# Patient Record
Sex: Female | Born: 1950 | Race: White | Hispanic: No | Marital: Married | Smoking: Former smoker
Health system: Southern US, Community
[De-identification: ages and names within clinical notes are randomized; demographics above are authoritative.]

## PROBLEM LIST (undated history)

## (undated) DIAGNOSIS — J189 Pneumonia, unspecified organism: Secondary | ICD-10-CM

## (undated) DIAGNOSIS — T8859XA Other complications of anesthesia, initial encounter: Secondary | ICD-10-CM

## (undated) DIAGNOSIS — K219 Gastro-esophageal reflux disease without esophagitis: Secondary | ICD-10-CM

## (undated) DIAGNOSIS — I10 Essential (primary) hypertension: Secondary | ICD-10-CM

## (undated) DIAGNOSIS — T4145XA Adverse effect of unspecified anesthetic, initial encounter: Secondary | ICD-10-CM

## (undated) DIAGNOSIS — M1990 Unspecified osteoarthritis, unspecified site: Secondary | ICD-10-CM

## (undated) DIAGNOSIS — I712 Thoracic aortic aneurysm, without rupture: Secondary | ICD-10-CM

## (undated) DIAGNOSIS — J45909 Unspecified asthma, uncomplicated: Secondary | ICD-10-CM

## (undated) HISTORY — PX: DILATION AND CURETTAGE, DIAGNOSTIC / THERAPEUTIC: SUR384

## (undated) HISTORY — DX: Gastro-esophageal reflux disease without esophagitis: K21.9

## (undated) HISTORY — DX: Essential (primary) hypertension: I10

## (undated) HISTORY — PX: HERNIA REPAIR: SHX51

## (undated) HISTORY — PX: OTHER SURGICAL HISTORY: SHX169

## (undated) HISTORY — PX: TONSILLECTOMY: SUR1361

---

## 1980-12-03 HISTORY — PX: COLON RESECTION: SHX5231

## 1999-01-05 ENCOUNTER — Other Ambulatory Visit: Admission: RE | Admit: 1999-01-05 | Discharge: 1999-01-05 | Payer: Self-pay | Admitting: Obstetrics and Gynecology

## 2000-09-06 ENCOUNTER — Other Ambulatory Visit: Admission: RE | Admit: 2000-09-06 | Discharge: 2000-09-06 | Payer: Self-pay | Admitting: Obstetrics and Gynecology

## 2001-09-10 ENCOUNTER — Other Ambulatory Visit: Admission: RE | Admit: 2001-09-10 | Discharge: 2001-09-10 | Payer: Self-pay | Admitting: Obstetrics and Gynecology

## 2001-12-11 ENCOUNTER — Encounter: Payer: Self-pay | Admitting: Urology

## 2001-12-11 ENCOUNTER — Ambulatory Visit (HOSPITAL_COMMUNITY): Admission: RE | Admit: 2001-12-11 | Discharge: 2001-12-11 | Payer: Self-pay | Admitting: Urology

## 2001-12-25 ENCOUNTER — Encounter (INDEPENDENT_AMBULATORY_CARE_PROVIDER_SITE_OTHER): Payer: Self-pay | Admitting: Specialist

## 2001-12-25 ENCOUNTER — Ambulatory Visit (HOSPITAL_BASED_OUTPATIENT_CLINIC_OR_DEPARTMENT_OTHER): Admission: RE | Admit: 2001-12-25 | Discharge: 2001-12-25 | Payer: Self-pay | Admitting: Urology

## 2002-09-22 ENCOUNTER — Other Ambulatory Visit: Admission: RE | Admit: 2002-09-22 | Discharge: 2002-09-22 | Payer: Self-pay | Admitting: Obstetrics and Gynecology

## 2003-09-29 ENCOUNTER — Other Ambulatory Visit: Admission: RE | Admit: 2003-09-29 | Discharge: 2003-09-29 | Payer: Self-pay | Admitting: Obstetrics and Gynecology

## 2004-02-04 ENCOUNTER — Other Ambulatory Visit: Admission: RE | Admit: 2004-02-04 | Discharge: 2004-02-04 | Payer: Self-pay | Admitting: Obstetrics and Gynecology

## 2004-10-20 ENCOUNTER — Other Ambulatory Visit: Admission: RE | Admit: 2004-10-20 | Discharge: 2004-10-20 | Payer: Self-pay | Admitting: Obstetrics and Gynecology

## 2005-10-23 ENCOUNTER — Other Ambulatory Visit: Admission: RE | Admit: 2005-10-23 | Discharge: 2005-10-23 | Payer: Self-pay | Admitting: Obstetrics and Gynecology

## 2006-11-06 ENCOUNTER — Ambulatory Visit: Payer: Self-pay | Admitting: Gastroenterology

## 2007-03-13 ENCOUNTER — Ambulatory Visit (HOSPITAL_COMMUNITY): Admission: RE | Admit: 2007-03-13 | Discharge: 2007-03-13 | Payer: Self-pay | Admitting: Obstetrics and Gynecology

## 2007-03-13 ENCOUNTER — Encounter (INDEPENDENT_AMBULATORY_CARE_PROVIDER_SITE_OTHER): Payer: Self-pay | Admitting: Specialist

## 2009-06-09 ENCOUNTER — Inpatient Hospital Stay (HOSPITAL_COMMUNITY): Admission: RE | Admit: 2009-06-09 | Discharge: 2009-06-10 | Payer: Self-pay | Admitting: Internal Medicine

## 2009-06-09 ENCOUNTER — Encounter (INDEPENDENT_AMBULATORY_CARE_PROVIDER_SITE_OTHER): Payer: Self-pay | Admitting: Surgery

## 2010-12-03 HISTORY — PX: APPENDECTOMY: SHX54

## 2010-12-27 ENCOUNTER — Encounter (INDEPENDENT_AMBULATORY_CARE_PROVIDER_SITE_OTHER): Payer: Self-pay | Admitting: *Deleted

## 2011-01-04 NOTE — Letter (Signed)
Summary: Pre Visit Letter Revised  [facility]  [address] [place], [place] [address]   Phone: [phone]  Fax: [phone]        12/27/2010 MRN: [id number] [name] [address] [place], [place]  [address]             Procedure Date:  February 06, 2011   recall col-Dr Abundio Miu to the Gastroenterology Division at [facility].    You are scheduled to see a nurse for your pre-procedure visit on January 23, 2011 at 10:00am on the 3rd floor at [facility], [address]  We ask that you try to arrive at our office 15 minutes prior to your appointment time to allow for check-in.  Please take a minute to review the attached form.  If you answer "Yes" to one or more of the questions on the first page, we ask that you call the person listed at your earliest opportunity.  If you answer "No" to all of the questions, please complete the rest of the form and bring it to your appointment.    Your nurse visit will consist of discussing your medical and surgical history, your immediate family medical history, and your medications.   If you are unable to list all of your medications on the form, please bring the medication bottles to your appointment and we will list them.  We will need to be aware of both prescribed and over the counter drugs.  We will need to know exact dosage information as well.    Please be prepared to read and sign documents such as consent forms, a financial agreement, and acknowledgement forms.  If necessary, and with your consent, a friend or relative is welcome to sit-in on the nurse visit with you.  Please bring your insurance card so that we may make a copy of it.  If your insurance requires a referral to see a specialist, please bring your referral form from your primary care physician.  No co-pay is required for this nurse visit.     If you cannot keep your appointment, please call [phone] to cancel or reschedule prior  to your appointment date.  This allows [place] the opportunity to schedule an appointment for another patient in need of care.    Thank you for choosing  Gastroenterology for your medical needs.  We appreciate the opportunity to care for you.  Please visit [place] at our website  to learn more about our practice.  Sincerely, The Gastroenterology Division

## 2011-01-22 ENCOUNTER — Encounter (INDEPENDENT_AMBULATORY_CARE_PROVIDER_SITE_OTHER): Payer: Self-pay | Admitting: *Deleted

## 2011-01-23 ENCOUNTER — Encounter: Payer: Self-pay | Admitting: Internal Medicine

## 2011-01-30 NOTE — Miscellaneous (Signed)
Summary: [facility] Previsit/prep  Clinical Lists Changes  Medications: Added new medication of MOVIPREP 100 GM  SOLR (PEG-KCL-NACL-NASULF-NA ASC-C) As per prep instructions. - Signed Rx of MOVIPREP 100 GM  SOLR (PEG-KCL-NACL-NASULF-NA ASC-C) As per prep instructions.;  #1 x 0;  Signed;  Entered by: Wyona Almas RN;  Authorized by: Iva Boop MD, [facility];  Method used: Electronically to [facility]  [id number]*, [address] [place], [place], [place]  [address], Ph: [phone] or [phone], Fax: [phone] Observations: Added new observation of NKA: T (01/23/2011 10:02)    Prescriptions: MOVIPREP 100 GM  SOLR (PEG-KCL-NACL-NASULF-NA ASC-C) As per prep instructions.  #1 x 0   Entered by:   Wyona Almas RN   Authorized by:   Iva Boop MD, [facility]   Signed by:   Wyona Almas RN on 01/23/2011   Method used:   Electronically to        [facility]  [phone]* (retail)       [address]       [place], [place]  [address]       Ph: [phone] or [phone]       Fax: [phone]   RxID:   [id number]

## 2011-01-30 NOTE — Letter (Signed)
Summary: [facility] Instructions  [facility]  [address] [place], [place] [address]   Phone: [phone]  Fax: [phone]       [name]    [birth year]    MRN: [id number]        Procedure Day Dorna Bloom:  Jake Shark  02/06/11     Arrival Time:  8:00AM     Procedure Time:  9:00AM     Location of Procedure:                    _ X_  [facility] (4th Floor)  PREPARATION FOR COLONOSCOPY WITH MOVIPREP   Starting 5 days prior to your procedure 02/01/11 do not eat nuts, seeds, popcorn, corn, beans, peas,  salads, or any raw vegetables.  Do not take any fiber supplements (e.g. Metamucil, Citrucel, and Benefiber).  THE DAY BEFORE YOUR PROCEDURE         DATE: 02/05/11  DAY: MONDAY  1.  Drink clear liquids the entire day-NO SOLID FOOD  2.  Do not drink anything colored red or purple.  Avoid juices with pulp.  No orange juice.  3.  Drink at least 64 oz. (8 glasses) of fluid/clear liquids during the day to prevent dehydration and help the prep work efficiently.  CLEAR LIQUIDS INCLUDE: Water Jello Ice Popsicles Tea (sugar ok, no milk/cream) Powdered fruit flavored drinks Coffee (sugar ok, no milk/cream) Gatorade Juice: apple, white grape, white cranberry  Lemonade Clear bullion, consomm, broth Carbonated beverages (any kind) Strained chicken noodle soup Hard Candy                             4.  In the morning, mix first dose of MoviPrep solution:    Empty 1 Pouch A and 1 Pouch B into the disposable container    Add lukewarm drinking water to the top line of the container. Mix to dissolve    Refrigerate (mixed solution should be used within 24 hrs)  5.  Begin drinking the prep at 5:00 p.m. The MoviPrep container is divided by 4 marks.   Every 15 minutes drink the solution down to the next mark (approximately 8 oz) until the full liter is complete.   6.  Follow completed prep with 16 oz of clear liquid of your choice (Nothing red or purple).  Continue  to drink clear liquids until bedtime.  7.  Before going to bed, mix second dose of MoviPrep solution:    Empty 1 Pouch A and 1 Pouch B into the disposable container    Add lukewarm drinking water to the top line of the container. Mix to dissolve    Refrigerate  THE DAY OF YOUR PROCEDURE      DATE: 02/06/11  DAY: TUESDAY  Beginning at 4:00AM (5 hours before procedure):         1. Every 15 minutes, drink the solution down to the next mark (approx 8 oz) until the full liter is complete.  2. Follow completed prep with 16 oz. of clear liquid of your choice.    3. You may drink clear liquids until 7:00AM (2 HOURS BEFORE PROCEDURE).   MEDICATION INSTRUCTIONS  Unless otherwise instructed, you should take regular prescription medications with a small sip of water   as early as possible the morning of your procedure.   Additional medication instructions: Hold HCTZ the morning of procedure.  OTHER INSTRUCTIONS  You will need a responsible adult at least 61 years of age to accompany you and drive you home.   This person must remain in the waiting room during your procedure.  Wear loose fitting clothing that is easily removed.  Leave jewelry and other valuables at home.  However, you may wish to bring a book to read or  an iPod/MP3 player to listen to music as you wait for your procedure to start.  Remove all body piercing jewelry and leave at home.  Total time from sign-in until discharge is approximately 2-3 hours.  You should go home directly after your procedure and rest.  You can resume normal activities the  day after your procedure.  The day of your procedure you should not:   Drive   Make legal decisions   Operate machinery   Drink alcohol   Return to work  You will receive specific instructions about eating, activities and medications before you leave.    The above instructions have been reviewed and explained to me by   Wyona Almas RN  January 23, 2011 10:38 AM     I fully understand and can verbalize these instructions _____________________________ Date _________

## 2011-02-06 ENCOUNTER — Encounter: Payer: Self-pay | Admitting: Internal Medicine

## 2011-02-06 ENCOUNTER — Other Ambulatory Visit (AMBULATORY_SURGERY_CENTER): Payer: Managed Care, Other (non HMO) | Admitting: Internal Medicine

## 2011-02-06 DIAGNOSIS — K5730 Diverticulosis of large intestine without perforation or abscess without bleeding: Secondary | ICD-10-CM

## 2011-02-06 DIAGNOSIS — Z1211 Encounter for screening for malignant neoplasm of colon: Secondary | ICD-10-CM

## 2011-02-13 NOTE — Procedures (Signed)
Summary: Colonoscopy  Patient: [name] Note: All result statuses are Final unless otherwise noted.  Tests: (1) Colonoscopy (COL)   COL Colonoscopy           DONE     [facility]     [address]     [place], [place]  [address]          COLONOSCOPY PROCEDURE REPORT          PATIENT:  [name], [name]  MR#:  [id number]     BIRTHDATE:  1950/12/05, 59 yrs. old  GENDER:  female     ENDOSCOPIST:  Iva Boop, MD, [facility]          PROCEDURE DATE:  02/06/2011     PROCEDURE:  Colonoscopy 308[address]     ASA CLASS:  Class II     INDICATIONS:  Routine Risk Screening     MEDICATIONS:   Fentanyl 75 mcg IV, Versed 8 mg IV          DESCRIPTION OF PROCEDURE:   After the risks benefits and     alternatives of the procedure were thoroughly explained, informed     consent was obtained.  Digital rectal exam was performed and     revealed no abnormalities.   The LB 180AL [id number] endoscope was     introduced through the anus and advanced to the cecum, which was     identified by both the appendix and ileocecal valve, without     limitations.  The quality of the prep was excellent, using     MoviPrep.  The instrument was then slowly withdrawn as the colon     was fully examined. Insertion: 2:44 minutes Withdrawal: 10:57     minutes     <<PROCEDUREIMAGES>>          FINDINGS:  Scattered diverticula were found in the left colon.     This was otherwise a normal examination of the colon including     right colon retroflexion.   Retroflexed views in the rectum     revealed no abnormalities.    The scope was then withdrawn from     the patient and the procedure completed.          COMPLICATIONS:  None     ENDOSCOPIC IMPRESSION:     1) Diverticula, scattered in the left colon     2) Otherwise normal examination, excellent prep          REPEAT EXAM:  In 10 year(s) for routine screening colonoscopy.          Iva Boop, MD, [facility]          CC:  Marinda Elk, MD and The Patient          n.     eSIGNED:   Iva Boop at 02/06/2011 09:57 AM          [name], [id number]  Note: An exclamation mark (!) indicates a result that was not dispersed into the flowsheet. Document Creation Date: 02/06/2011 9:57 AM _______________________________________________________________________  (1) Order result status: Final Collection or observation date-time: 02/06/2011 09:49 Requested date-time:  Receipt date-time:  Reported date-time:  Referring Physician:   Ordering Physician: Stan Head [phone]) Specimen Source:  Source: Launa Grill Order Number: [id number] Lab site:   Appended Document: Colonoscopy    Clinical Lists Changes  Observations: Added new observation of COLONNXTDUE: 01/2021 (02/06/2011 16:26)

## 2011-03-11 LAB — DIFFERENTIAL
Basophils Absolute: 0.1 10*3/uL (ref 0.0–0.1)
Eosinophils Absolute: 0.2 10*3/uL (ref 0.0–0.7)
Eosinophils Relative: 2 % (ref 0–5)
Lymphocytes Relative: 25 % (ref 12–46)
Lymphs Abs: 3.3 10*3/uL (ref 0.7–4.0)
Monocytes Absolute: 0.7 10*3/uL (ref 0.1–1.0)

## 2011-03-11 LAB — CBC
HCT: 40.1 % (ref 36.0–46.0)
Hemoglobin: 13.6 g/dL (ref 12.0–15.0)
MCV: 85.8 fL (ref 78.0–100.0)
Platelets: 230 10*3/uL (ref 150–400)
RDW: 14.8 % (ref 11.5–15.5)

## 2011-03-11 LAB — BASIC METABOLIC PANEL
BUN: 11 mg/dL (ref 6–23)
CO2: 27 mEq/L (ref 19–32)
Chloride: 106 mEq/L (ref 96–112)
GFR calc non Af Amer: >60 mL/min (ref >60)
Glucose, Bld: 98 mg/dL (ref 70–99)
Potassium: 3.6 mEq/L (ref 3.5–5.1)
Sodium: 140 mEq/L (ref 135–145)

## 2011-03-11 LAB — CREATININE, SERUM: Creatinine, Ser: 0.71 mg/dL (ref 0.4–1.2)

## 2011-03-11 LAB — BUN: BUN: 12 mg/dL (ref 6–23)

## 2011-04-17 NOTE — Op Note (Signed)
[name], [name]               ACCOUNT NO.:  [id number]   MEDICAL RECORD NO.:  [id number]          PATIENT TYPE:  INP   LOCATION:  0098                         FACILITY:  [facility]   PHYSICIAN:  Velora Heckler, MD      DATE OF BIRTH:  01-20-1951   DATE OF PROCEDURE:  06/10/2009  DATE OF DISCHARGE:                               OPERATIVE REPORT   PREOPERATIVE DIAGNOSIS:  Acute appendicitis.   POSTOPERATIVE DIAGNOSIS:  Acute appendicitis.   PROCEDURE:  Laparoscopic appendectomy.   SURGEON:  Velora Heckler, MD, FACS   ANESTHESIA:  General per Dr. Jill Side.   ESTIMATED BLOOD LOSS:  Minimal.   PREPARATION:  Betadine.   COMPLICATIONS:  None.   INDICATIONS:  The patient is a 60 year old white female who presents to  the emergency department at [facility] on [facility] from [facility] with signs and symptoms of acute appendicitis.  The patient  underwent a CT scan of the abdomen at [facility] which  confirmed the diagnosis of acute appendicitis.  General surgery was  called for management and the patient was prepared for the operating  room.   BODY OF REPORT:  Procedure was done in OR #1 at the [facility].  The patient was brought to the operating room and  placed in a supine position on the operating room table.  Following  administration of general anesthesia, the patient was positioned and  then prepped and draped in the usual strict aseptic fashion.  After  ascertaining that an adequate level of anesthesia had been achieved, an  infraumbilical incision was made in the midline with a #15 blade.  Dissection was carried down to the fascia.  The fascia was incised in  the midline and the peritoneal cavity was entered cautiously.  A 0  Vicryl pursestring suture was placed in the fascia.  A Hassan cannula  was introduced under direct vision and secured with a pursestring  suture.  The abdomen was insufflated with carbon  dioxide.  Laparoscope  was introduced and the abdomen explored.  Operative port was placed in  the right upper quadrant.  Adhesions in the right mid abdomen were taken  down with the harmonic scalpel.  This provided visualization of the  descending colon and cecum.  There was clear fluid present within the  pelvis.  There were some inflammatory changes in the right midabdomen  and clear yellow fluid.  The appendix was identified.  It was tense and  distended, but not grossly perforated.  Operative port was placed in the  left upper quadrant.  Using the harmonic scalpel, the mesoappendix was  taken down.  Dissection was carried down to the base of the appendix  with some difficulty.  The base of the appendix was exposed.  Using an  Endo-GIA stapler, the base of the appendix was transected at its  junction with the cecal wall.  The appendix was placed into an EndoCatch  bag and withdrawn through the umbilical port.  It was submitted to  pathology.  The right lower quadrant  was irrigated with warm saline  which was evacuated.  Fluid was evacuated from the pelvis, as well as  from the right upper quadrant.  Good hemostasis was noted at the staple  line and along the mesoappendix.  The area was covered with the omentum.  Pneumoperitoneum was released and ports were removed.  Good hemostasis  was noted.  The 0 Vicryl pursestring suture was tied securely.  The skin  incisions were cauterized with the electrocautery.  The wounds were  closed with interrupted 4-0 Monocryl subcuticular sutures.  The wounds  were washed and dried and Benzoin and Steri-Strips were applied.  Sterile dressings were applied.  The patient was awakened from  anesthesia and brought to the recovery room in stable condition.  The  patient tolerated the procedure well.      Velora Heckler, MD  Electronically Signed     TMG/MEDQ  D:  06/10/2009  T:  06/10/2009  Job:  [id number]   cc:   Molly Maduro L. Foy Guadalajara, M.D.  Fax:  [phone]   Peyton Najjar, MD   Miguel Aschoff, M.D.

## 2011-04-17 NOTE — H&P (Signed)
NAMESUMAYYA, MUHA               ACCOUNT NO.:  [id number]   MEDICAL RECORD NO.:  [id number]          PATIENT TYPE:  EMS   LOCATION:  ED                           [facility]  [facility]   PHYSICIAN:  Velora Heckler, MD      DATE OF BIRTH:  May 25, 1951   DATE OF ADMISSION:  06/09/2009  DATE OF DISCHARGE:                              HISTORY & PHYSICAL   REFERRING PHYSICIAN:  Peyton Najjar, MD, [facility].   PRIMARY PHYSICIAN:  Dr. Marinda Elk, [facility].   GYNECOLOGIST:  Miguel Aschoff, M.D.   CHIEF COMPLAINT:  Abdominal pain, acute appendicitis.   HISTORY OF PRESENT ILLNESS:  [name] is a 60 year old white  female from [place], [place].  She presented today to [facility] with a 24-hour history of abdominal pain localizing to the  right lower quadrant.  The patient was sent to [facility] for  CT scan of the abdomen and pelvis which revealed findings consistent  with acute appendicitis.  Laboratory studies are pending.  General  surgery was asked to evaluate and manage for acute appendicitis.   PAST MEDICAL HISTORY:  1. Status post cesarean section.  2. History of colon perforation with resection.  3. History of hypertension.   MEDICATIONS:  Hormone-replacement therapy, hydrochlorothiazide, vitamin  D.   ALLERGIES:  NONE KNOWN.   SOCIAL HISTORY:  The patient is married with two children.  She is  accompanied by her husband.  She is a stay-at-home mom.  She quit  smoking in 1976.  She drinks alcohol on occasion.   FAMILY HISTORY:  Noncontributory.   A 15 system review without significant other findings except as noted  above.   PHYSICAL EXAMINATION:  GENERAL:  A 60 year old well-developed, well-  nourished white female in no acute distress.  VITAL SIGNS:  Temperature 98.4, pulse 69, respirations 20, blood  pressure 118/69.  HEENT:  Shows her to be normocephalic, atraumatic.  Sclerae clear.  Conjunctivae clear.   Pupils equal and reactive.  Dentition good.  Mucous  membranes moist.  Voice normal.  NECK:  Supple, nontender without mass.  CHEST:  Auscultation of the chest shows good breath sounds bilaterally  without rales, rhonchi or wheeze.  CARDIAC:  Shows regular rate and rhythm without murmur.  Peripheral  pulses are full.  EXTREMITIES:  Nontender without edema.  ABDOMEN:  Soft without distention.  Bowel sounds were present.  Well-  healed surgical wound left lower quadrant.  Palpation reveals tenderness  in the right lower quadrant with voluntary guarding.  No mass.  No  hepatosplenomegaly.  EXTREMITIES:  Nontender without edema.  NEUROLOGICAL:  The patient is alert and oriented without focal deficit.  There is no sign of tremor.   LABORATORY STUDIES:  White count 12.9, hemoglobin 13.6, hematocrit  40.1%, platelet count 230,000, BUN 12, creatinine 0.71.   RADIOGRAPHIC STUDIES:  CT scan of the abdomen and pelvis with findings  consistent with acute appendicitis reviewed in the emergency department.   IMPRESSION:  Acute appendicitis.   PLAN:  I discussed at length with the  patient and her husband the  indications for surgery.  I explained laparoscopic appendectomy.  I  explained the possibility of conversion to an open procedure.  I  explained the hospital stay to be expected and her recovery.  They  understand and wish to proceed.  We will make arrangements with the  operating room immediately.      Velora Heckler, MD  Electronically Signed     TMG/MEDQ  D:  06/09/2009  T:  06/10/2009  Job:  [id number]   cc:   Miguel Aschoff, M.D.   Peyton Najjar, MD   Doris Cheadle. Foy Guadalajara, M.D.  Fax: [phone]

## 2011-04-20 NOTE — Op Note (Signed)
NAMEDEBBI, [name]               ACCOUNT NO.:  [id number]   MEDICAL RECORD NO.:  [id number]          PATIENT TYPE:  AMB   LOCATION:  SDC                           FACILITY:  [facility]   PHYSICIAN:  Miguel Aschoff, M.D.       DATE OF BIRTH:  1951-09-09   DATE OF PROCEDURE:  DATE OF DISCHARGE:                               OPERATIVE REPORT   PREOPERATIVE DIAGNOSIS:  Irregular vaginal bleeding, hormone replacement  therapy.   POSTOPERATIVE DIAGNOSIS:  Irregular vaginal bleeding, hormone  replacement therapy.   PROCEDURE:  Cervical dilatation, hysteroscopy, uterine curettage.   SURGEON:  Dr. Miguel Aschoff.   ANESTHESIA:  IV sedation with paracervical block.   COMPLICATIONS:  None.   JUSTIFICATION:  The patient is a 60 year old white female on Climara Pro  therapy.  The patient had episodes of irregular bleeding the etiology of  which is not clear.  She presents now to undergo hysteroscopy and D&C to  assess the cause of bleeding, see if therapy could be instituted.  The  risks and benefits of procedure were discussed with the patient.   PROCEDURE:  The patient was taken to the operating placed in supine  position and IV sedation was administered without difficulty.  She was  then placed in dorsolithotomy position, prepped and draped in usual  sterile fashion.  Examination under anesthesia revealed normal external  genitalia, normal Bartholin and Skene's glands, normal urethra.  The  vaginal vault was without gross lesion.  The cervix was somewhat  stenotic.  The uterus was anterior normal size and shape.  No adnexal  masses were noted.  At this point speculum was placed in the vaginal  vault.  Anterior cervical lip was grasped with tenaculum and then using  serial Pratt dilators, the cervix was dilated until  #25 Pratt dilator  could be passed.  Once this was done, the diagnostic hysteroscope was  advanced through the endocervix.  No endocervical lesions were noted.  Inspection at the  endometrial cavity revealed only a small submucous  myoma in the left lower portion of the endometrial cavity.  The  remainder of the cavity appeared unremarkable with no polyps noted.  There are no other lesions noted within the cavity.  At this point the  hysteroscope was removed and vigorous sharp curettage was carried out  using a medium-size serrated curette.  The endometrial tissue was sent  for histologic study.  At this point the procedure was completed.  The  instruments were removed.  Hemostasis was readily achieved.  The patient  was taken out of lithotomy position, brought to recovery room in  satisfactory condition.  The estimated blood loss was approximately 30  mL.   PLAN:  Patient to be discharged home.  Medications for home include  doxycycline 100 mg twice a day x3 days, Darvocet N 100 one every 4 hours  as needed for pain.  The patient is to continue her Climara Pro.  She is  to call for pathology report in 5 days.  She is to call any problems  such as fever, pain or heavy bleeding.  She will be seen back in four  weeks for follow-up exam.      Miguel Aschoff, M.D.  Electronically Signed     AR/MEDQ  D:  03/13/2007  T:  03/13/2007  Job:  [id number]

## 2011-04-20 NOTE — Op Note (Signed)
[facility]  Patient:    [name], [name] Visit Number: [id number] MRN: [id number]          Service Type: NES Location: NESC Attending Physician:  Laqueta Jean Dictated by:   Vonzell Schlatter Patsi Sears, M.D. Proc. Date: 12/25/01 Admit Date:  12/25/2001 Discharge Date: 12/25/2001                             Operative Report  PREOPERATIVE DIAGNOSIS:  Functional inhibitory concentration.  POSTOPERATIVE DIAGNOSIS:  Functional inhibitory concentration.  OPERATION:  Cystourethroscopy, hydrodistention of bladder (900 cc to 1000 cc), biopsy of the bladder, cauterization of biopsy sites, insertion of Pyridium and Marcaine into the bladder, injection of Marcaine and Kenalog into the subtrigonal space, insertion of B&O suppository and Xylocaine into the bladder.  PREPARATION:  After appropriate preanesthesia, the patient is brought to the operating room and placed on the operating table in the dorsal supine position, where general LMA anesthesia was introduced. She was then re-placed in a dorsal lithotomy position, where the pubis was prepped with Betadine solution and draped in the usual fashion.  REVIEW OF HISTORY:  This 60 year old female has a history of failure of antibiotic therapy for presumed bladder infection.  She has also had hematuria, with no white cells.  She complains of urinary burning and pelvic pain and spasm; has been treated for interstitial cystitis by Dr. Georgiann Mccoy in [place], [place] 15 years ago.  She is now for IC evaluation.  DESCRIPTION OF PROCEDURE:  The urethra is dilated to a size 26-French, with the Siemens urethral sounds.  A 22-French cystoscope was placed in the bladder, and hydrodistention revealed the 900 cc bladder, which distends to 1000 cc.  Biopsy is taken of the bladder, and biopsy sites are cauterized. Marcaine and pyridium solution is inserted into the bladder, and Marcaine and Kenalog is injected into  the bladder base transvaginally.  Xylocaine jelly is placed in the bladder, B&O suppository is placed (beginning of case), and the patient is awakened after given IV Toradol.  She was taken to recovery room in good condition. Dictated by:   Vonzell Schlatter Patsi Sears, M.D. Attending Physician:  Laqueta Jean DD:  12/25/01 TD:  12/26/01 Job: [id number]

## 2013-12-02 ENCOUNTER — Other Ambulatory Visit: Payer: Self-pay | Admitting: Obstetrics and Gynecology

## 2014-02-18 ENCOUNTER — Encounter: Payer: Self-pay | Admitting: Podiatry

## 2014-02-18 ENCOUNTER — Ambulatory Visit (INDEPENDENT_AMBULATORY_CARE_PROVIDER_SITE_OTHER): Payer: BC Managed Care – PPO

## 2014-02-18 ENCOUNTER — Ambulatory Visit (INDEPENDENT_AMBULATORY_CARE_PROVIDER_SITE_OTHER): Payer: BC Managed Care – PPO | Admitting: Podiatry

## 2014-02-18 VITALS — BP 146/78 | HR 67 | Resp 12

## 2014-02-18 DIAGNOSIS — R52 Pain, unspecified: Secondary | ICD-10-CM

## 2014-02-18 DIAGNOSIS — M7750 Other enthesopathy of unspecified foot: Secondary | ICD-10-CM

## 2014-02-18 MED ORDER — TRIAMCINOLONE ACETONIDE 10 MG/ML IJ SUSP
10.0000 mg | Freq: Once | INTRAMUSCULAR | Status: AC
Start: 2014-02-18 — End: 2014-02-18
  Administered 2014-02-18: 10 mg

## 2014-02-18 NOTE — Progress Notes (Signed)
   Subjective:    Patient ID: [name], female    DOB: 02/18/1951, 63 y.o.   MRN: [id number]  HPI PT STATED LT BACK OF THE LT FOOT IS SORE AND HAVE BURNING SENSATION FOR 1 MONTH. SOMETIMES IS GETTING WORSE FIRST THING IN THE MORNING, BUT TRIED NO TREATMENT.    Review of Systems  All other systems reviewed and are negative.       Objective:   Physical Exam        Assessment & Plan:

## 2014-02-19 NOTE — Progress Notes (Signed)
Subjective:     Patient ID: [name], female   DOB: [birth year], 63 y.o.   MRN: [id number]  HPI patient states she is getting pain in the back of the left heel and she's not sure what might be causing this but she has trouble walk   Review of Systems     Objective:   Physical Exam Neurovascular status intact with inflammation and pain posterior lateral aspect left heel with central and medial good and no muscle strength loss    Assessment:     Achilles tendinitis left    Plan:     Advised on injection explained risk of tear associated with this patient wants to have this understanding risks and today I did a very careful lateral injection 3 mg dexamethasone Kenalog 5 mg Xylocaine and advised him reduced activity. Reappoint as symptoms indicate

## 2014-08-12 ENCOUNTER — Encounter: Payer: Self-pay | Admitting: Internal Medicine

## 2014-12-08 ENCOUNTER — Other Ambulatory Visit: Payer: Self-pay | Admitting: Obstetrics and Gynecology

## 2014-12-09 LAB — CYTOLOGY - PAP

## 2015-05-17 ENCOUNTER — Encounter: Payer: Self-pay | Admitting: Internal Medicine

## 2015-05-17 ENCOUNTER — Ambulatory Visit (INDEPENDENT_AMBULATORY_CARE_PROVIDER_SITE_OTHER)
Admission: RE | Admit: 2015-05-17 | Discharge: 2015-05-17 | Disposition: A | Discharge status: Home / Self Care | Payer: BLUE CROSS/BLUE SHIELD | Source: Ambulatory Visit | Attending: Internal Medicine | Admitting: Internal Medicine

## 2015-05-17 ENCOUNTER — Ambulatory Visit (INDEPENDENT_AMBULATORY_CARE_PROVIDER_SITE_OTHER): Payer: BLUE CROSS/BLUE SHIELD | Admitting: Internal Medicine

## 2015-05-17 VITALS — BP 124/70 | HR 74 | Ht 69.0 in | Wt 191.4 lb

## 2015-05-17 DIAGNOSIS — R05 Cough: Secondary | ICD-10-CM | POA: Diagnosis not present

## 2015-05-17 DIAGNOSIS — R942 Abnormal results of pulmonary function studies: Secondary | ICD-10-CM

## 2015-05-17 DIAGNOSIS — R059 Cough, unspecified: Secondary | ICD-10-CM

## 2015-05-17 DIAGNOSIS — R058 Other specified cough: Secondary | ICD-10-CM

## 2015-05-17 NOTE — Progress Notes (Signed)
Quick Note:  Spoke with pt and notified of results per Dr. Wert. Pt verbalized understanding and denied any questions.  ______ 

## 2015-05-17 NOTE — Progress Notes (Addendum)
Subjective:    Patient ID: [name], female    DOB: 1951-08-08,    MRN: [id number]  HPI  75 yowf never regular smoker lived in [place] where in 3rd grade  pna bad pna /prolonged admit and felt like recovered but since young adult tendency to head colds to end up in chest with worsening severity of cough > vomit with 2 episodes in 2016 the first in March and again in May referred by [name] for recurrent cough with evidence of airflow obst on office spirometry 05/12/15    05/17/2015 1st [facility] office visit/ [name]   Chief Complaint  Patient presents with  . Pulmonary Consult    Referred by Joanne. Foy Howard. Pt c/o cough "for years" worse for the past 2 yrs with wheezing,    tends to cough/ clear throat sev times a day/ sometimes at hs but not typically first thing in am and usually nonproductive, hears herself wheeze When cough worse takes codeine and lots of cough drops / sometimes abx/ pred help/ assoc with intermittent HB/ takes prn ppi dulera tried/shaking and cough no better   No obvious day to day or daytime variabilty or assoc sob or  cp or chest tightness,  overt sinus   symptoms. No unusual exp hx or h/o childhood pna/ asthma or knowledge of premature birth.  Sleeping ok without nocturnal  or early am exacerbation  of respiratory  c/o's or need for noct saba. Also denies any obvious fluctuation of symptoms with weather or environmental changes or other aggravating or alleviating factors except as outlined above   Current Medications, Allergies, Complete Past Medical History, Past Surgical History, Family History, and Social History were reviewed in [employer] record.       Review of Systems  Constitutional: Negative for fever, chills and unexpected weight change.  HENT: Negative for congestion, dental problem, ear pain, nosebleeds, postnasal drip, rhinorrhea, sinus pressure, sneezing, sore throat, trouble swallowing and voice change.     Eyes: Negative for visual disturbance.  Respiratory: Positive for cough. Negative for choking and shortness of breath.   Cardiovascular: Negative for chest pain and leg swelling.  Gastrointestinal: Negative for vomiting, abdominal pain and diarrhea.  Genitourinary: Negative for difficulty urinating.       Acid heartburn   Musculoskeletal: Negative for arthralgias.  Skin: Negative for rash.  Neurological: Negative for tremors, syncope and headaches.  Hematological: Does not bruise/bleed easily.       Objective:   Physical Exam  Pleasant amb wf nad with classic pseudowheeze only on FVC maneuver   Wt Readings from Last 3 Encounters:  05/17/15 191 lb 6.4 oz (86.818 kg)    Vital signs reviewed   HEENT: nl dentition, turbinates, and orophanx. Nl external ear canals without cough reflex   NECK :  without JVD/Nodes/TM/ nl carotid upstrokes bilaterally   LUNGS: no acc muscle use, clear to A and P bilaterally without cough on insp or exp maneuvers   CV:  RRR  no s3 or murmur or increase in P2, no edema   ABD:  soft and nontender with nl excursion in the supine position. No bruits or organomegaly, bowel sounds nl  MS:  warm without deformities, calf tenderness, cyanosis or clubbing  SKIN: warm and dry without lesions    NEURO:  alert, approp, no deficits     I personally reviewed images and agree with radiology impression as follows:  CXR:  05/17/15 The lungs are clear. Heart  size is normal. There is no pneumothorax or pleural effusion. No focal bony abnormality is identified.       Assessment & Plan:

## 2015-05-17 NOTE — Patient Instructions (Addendum)
Please remember to go to the x-ray department downstairs for your tests - we will call you with the results when they are available.  Change prilosec 40 mg  Take  30-60 min before first meal of the day and Pepcid (famotidine)  20 mg one @  bedtime until return to office - this is the best way to tell whether stomach acid is contributing to your problem.    GERD (REFLUX)  is an extremely common cause of respiratory symptoms just like yours , many times with no obvious heartburn at all.    It can be treated with medication, but also with lifestyle changes including avoidance of late meals, elevation of the head of your bed (ideally with 6 inch  bed blocks) excessive alcohol, smoking cessation, and avoid fatty foods, chocolate, peppermint, colas, red wine, and acidic juices such as orange juice.  NO MINT OR MENTHOL PRODUCTS SO NO COUGH DROPS  USE SUGARLESS CANDY INSTEAD (Jolley ranchers or Stover's or Life Savers) or even ice chips will also do - the key is to swallow to prevent all throat clearing. NO OIL BASED VITAMINS - use powdered substitutes - no Fish oil.   As needed ok to use xopenex hfa  Up to 2 pffs every 4 hours for breathing or coughing problems    Please schedule a follow up office visit in 6 weeks, call sooner if needed

## 2015-05-18 ENCOUNTER — Encounter: Payer: Self-pay | Admitting: Internal Medicine

## 2015-05-18 DIAGNOSIS — J4530 Mild persistent asthma, uncomplicated: Secondary | ICD-10-CM | POA: Insufficient documentation

## 2015-05-18 NOTE — Assessment & Plan Note (Signed)
The most common causes of chronic cough in immunocompetent adults include the following: upper airway cough syndrome (UACS), previously referred to as postnasal drip syndrome (PNDS), which is caused by variety of rhinosinus conditions; (2) asthma; (3) GERD; (4) chronic bronchitis from cigarette smoking or other inhaled environmental irritants; (5) nonasthmatic eosinophilic bronchitis; and (6) bronchiectasis.   These conditions, singly or in combination, have accounted for up to 94% of the causes of chronic cough in prospective studies.   Other conditions have constituted no >6% of the causes in prospective studies These have included bronchogenic carcinoma, chronic interstitial pneumonia, sarcoidosis, left ventricular failure, ACEI-induced cough, and aspiration from a condition associated with pharyngeal dysfunction.    Chronic cough is often simultaneously caused by more than one condition. A single cause has been found from 38 to 82% of the time, multiple causes from 18 to 62%. Multiply caused cough has been the result of three diseases up to 42% of the time.       Based on hx and exam, this is most likely:  Classic Upper airway cough syndrome, so named because it's frequently impossible to sort out how much is  CR/sinusitis with freq throat clearing (which can be related to primary GERD)   vs  causing  secondary (" extra esophageal")  GERD from wide swings in gastric pressure that occur with throat clearing, often  promoting self use of mint and menthol lozenges that reduce the lower esophageal sphincter tone and exacerbate the problem further in a cyclical fashion.   These are the same pts (now being labeled as having "irritable larynx syndrome" by some cough centers) who not infrequently have a history of having failed to tolerate ace inhibitors,  dry powder inhalers or biphosphonates or report having atypical reflux symptoms that don't respond to standard doses of PPI - esp if taken prn, and are  easily confused as having aecopd or asthma flares by even experienced allergists/ pulmonologists.   The first step is to maximize acid suppression and eliminate cyclical coughing then regroup    See instructions for specific recommendations which were reviewed directly with the patient who was given a copy with highlighter outlining the key components.

## 2015-05-18 NOTE — Assessment & Plan Note (Signed)
Spirometry 05/12/15 abn f/v loop   I agree with Dr Pablo Lawrence interpretation - the f/v loop is basically nl except when she coughed and there is no sign airflow obst.

## 2015-07-12 ENCOUNTER — Ambulatory Visit (INDEPENDENT_AMBULATORY_CARE_PROVIDER_SITE_OTHER): Payer: BLUE CROSS/BLUE SHIELD | Admitting: Internal Medicine

## 2015-07-12 ENCOUNTER — Encounter: Payer: Self-pay | Admitting: Internal Medicine

## 2015-07-12 VITALS — BP 110/70 | HR 71 | Ht 68.5 in | Wt 190.0 lb

## 2015-07-12 DIAGNOSIS — R05 Cough: Secondary | ICD-10-CM

## 2015-07-12 DIAGNOSIS — R058 Other specified cough: Secondary | ICD-10-CM

## 2015-07-12 DIAGNOSIS — R059 Cough, unspecified: Secondary | ICD-10-CM

## 2015-07-12 DIAGNOSIS — J4530 Mild persistent asthma, uncomplicated: Secondary | ICD-10-CM | POA: Diagnosis not present

## 2015-07-12 LAB — PULMONARY FUNCTION TEST
DL/VA % PRED: 85 %
DL/VA: 4.54 ml/min/mmHg/L
DLCO UNC: 24.18 ml/min/mmHg
DLCO unc % pred: 79 %
FEF 25-75 Post: 1.38 L/sec
FEF 25-75 Pre: 1.19 L/sec
FEF2575-%CHANGE-POST: 15 %
FEF2575-%PRED-POST: 57 %
FEF2575-%Pred-Pre: 49 %
FEV1-%CHANGE-POST: 5 %
FEV1-%PRED-POST: 82 %
FEV1-%PRED-PRE: 78 %
FEV1-PRE: 2.24 L
FEV1-Post: 2.36 L
FEV1FVC-%Change-Post: 4 %
FEV1FVC-%PRED-PRE: 83 %
FEV6-%Change-Post: 2 %
FEV6-%PRED-POST: 98 %
FEV6-%PRED-PRE: 95 %
FEV6-POST: 3.53 L
FEV6-PRE: 3.43 L
FEV6FVC-%Change-Post: 1 %
FEV6FVC-%PRED-POST: 103 %
FEV6FVC-%PRED-PRE: 102 %
FVC-%Change-Post: 1 %
FVC-%Pred-Post: 94 %
FVC-%Pred-Pre: 93 %
FVC-POST: 3.54 L
FVC-Pre: 3.49 L
POST FEV6/FVC RATIO: 100 %
PRE FEV1/FVC RATIO: 64 %
PRE FEV6/FVC RATIO: 98 %
Post FEV1/FVC ratio: 67 %
RV % PRED: 73 %
RV: 1.7 L
TLC % PRED: 94 %
TLC: 5.41 L

## 2015-07-12 MED ORDER — MONTELUKAST SODIUM 10 MG PO TABS: 10.0000 mg | ORAL_TABLET | Freq: Every day | ORAL | Status: DC

## 2015-07-12 NOTE — Progress Notes (Signed)
Subjective:    Patient ID: [name], female    DOB: 06-Dec-1950    MRN: [id number]    Brief patient profile:  71 yowf never regular smoker lived in [place] where in 3rd grade  bad pna /prolonged admit and felt like recovered but since young adult tendency to head colds to end up in chest with worsening severity of cough > vomit with 2 episodes in 2016 the first in March 2016  and again in May 2016  referred by Dr Foy Guadalajara for recurrent cough with evidence of airflow obst on office spirometry 05/12/15 and pfts done 07/12/2015 c/w very mild chronic airflow obst with  Non reversible component.    History of Present Illness  05/17/2015 1st [facility] office visit/ [name]   Chief Complaint  Patient presents with  . Pulmonary Consult    Referred by Dr. Foy Guadalajara. Pt c/o cough "for years" worse for the past 2 yrs with wheezing,    tends to cough/ clear throat sev times a day/ sometimes at hs but not typically first thing in am and usually nonproductive, hears herself wheeze When cough worse takes codeine and lots of cough drops / sometimes abx/ pred help/ assoc with intermittent HB/ takes prn ppi dulera tried/shaking and cough no better  rec Please remember to go to the x-ray department downstairs for your tests - we will call you with the results when they are available. Change prilosec 40 mg  Take  30-60 min before first meal of the day and Pepcid (famotidine)  20 mg one @  bedtime until return to office - this is the best way to tell whether stomach acid is contributing to your problem.   GERD  Diet  As needed ok to use xopenex hfa  Up to 2 pffs every 4 hours for breathing or coughing problems     07/12/2015 f/u ov/[name] re: mild persistent asthma  Chief Complaint  Patient presents with  . Follow-up    PFT done today. Pt states her cough has improved some. No new co's today.   was taking acid suppression prior to the last flare and had same severe head cold to chest symptoms  progression  Presently no symptoms and no need for xopenex at all    No obvious day to day or daytime variability or assoc   cp or chest tightness, subjective wheeze or overt sinus or hb symptoms. No unusual exp hx or h/o childhood pna/ asthma or knowledge of premature birth.  Sleeping ok without nocturnal  or early am exacerbation  of respiratory  c/o's or need for noct saba. Also denies any obvious fluctuation of symptoms with weather or environmental changes or other aggravating or alleviating factors except as outlined above   Current Medications, Allergies, Complete Past Medical History, Past Surgical History, Family History, and Social History were reviewed in [employer] record.  ROS  The following are not active complaints unless bolded sore throat, dysphagia, dental problems, itching, sneezing,  nasal congestion or excess/ purulent secretions, ear ache,   fever, chills, sweats, unintended wt loss, classically pleuritic or exertional cp, hemoptysis,  orthopnea pnd or leg swelling, presyncope, palpitations, abdominal pain, anorexia, nausea, vomiting, diarrhea  or change in bowel or bladder habits, change in stools or urine, dysuria,hematuria,  rash, arthralgias, visual complaints, headache, numbness, weakness or ataxia or problems with walking or coordination,  change in mood/affect or memory.  Objective:   Physical Exam  Pleasant amb wf nad    Wt Readings from Last 3 Encounters:  07/12/15 190 lb (86.183 kg)  05/17/15 191 lb 6.4 oz (86.818 kg)    Vital signs reviewed     HEENT: nl dentition, turbinates, and orophanx. Nl external ear canals without cough reflex   NECK :  without JVD/Nodes/TM/ nl carotid upstrokes bilaterally   LUNGS: no acc muscle use, clear to A and P bilaterally without cough on insp or exp maneuvers   CV:  RRR  no s3 or murmur or increase in P2, no edema   ABD:  soft and nontender with nl excursion in the supine  position. No bruits or organomegaly, bowel sounds nl  MS:  warm without deformities, calf tenderness, cyanosis or clubbing  SKIN: warm and dry without lesions    NEURO:  alert, approp, no deficits     I personally reviewed images and agree with radiology impression as follows:  CXR:  05/17/15 The lungs are clear. Heart size is normal. There is no pneumothorax or pleural effusion. No focal bony abnormality is identified.       Assessment & Plan:

## 2015-07-12 NOTE — Patient Instructions (Addendum)
singulair 10 mg one daily typically taken in evening automatically   Please schedule a follow up visit in 6 months but call sooner if needed  If continues to flare needs sinus ct/ allergy profile and trial of qvar maint rx

## 2015-07-12 NOTE — Assessment & Plan Note (Signed)
-   PFT's while asymptomatic 07/12/2015  FEV1  2.36 (82%) ratio 67 and no better p saba and dlco 79% corrects to 85%  Unclear how much asthma contributes to her cough or decompensation with uri's but this pattern has been more frequent x one year despite maint rx with gerd rx so rec trial of singulair  I had an extended discussion with the patient reviewing all relevant studies completed to date and  lasting 15 to 20 minutes of a 25 minute visit    Each maintenance medication was reviewed in detail including most importantly the difference between maintenance and prns and under what circumstances the prns are to be triggered using an action plan format that is not reflected in the computer generated alphabetically organized AVS.    Please see instructions for details which were reviewed in writing and the patient given a copy highlighting the part that I personally wrote and discussed at today's ov.

## 2015-07-12 NOTE — Progress Notes (Signed)
PFT done today. 

## 2015-07-12 NOTE — Assessment & Plan Note (Signed)
The challenge is to prevent recurrence at this point > see mild chronic asthma for rx with singulair maint rx

## 2015-09-03 DIAGNOSIS — I77810 Thoracic aortic ectasia: Secondary | ICD-10-CM

## 2015-09-03 DIAGNOSIS — I712 Thoracic aortic aneurysm, without rupture: Secondary | ICD-10-CM

## 2015-09-03 DIAGNOSIS — I7121 Aneurysm of the ascending aorta, without rupture: Secondary | ICD-10-CM

## 2015-09-03 HISTORY — DX: Thoracic aortic ectasia: I77.810

## 2015-09-03 HISTORY — DX: Aneurysm of the ascending aorta, without rupture: I71.21

## 2015-09-03 HISTORY — DX: Thoracic aortic aneurysm, without rupture: I71.2

## 2015-09-28 ENCOUNTER — Ambulatory Visit (HOSPITAL_COMMUNITY)
Admission: RE | Admit: 2015-09-28 | Discharge: 2015-09-28 | Disposition: A | Discharge status: Home / Self Care | Payer: BLUE CROSS/BLUE SHIELD | Source: Ambulatory Visit | Attending: Internal Medicine | Admitting: Internal Medicine

## 2015-09-28 ENCOUNTER — Ambulatory Visit (INDEPENDENT_AMBULATORY_CARE_PROVIDER_SITE_OTHER)
Admission: RE | Admit: 2015-09-28 | Discharge: 2015-09-28 | Disposition: A | Discharge status: Home / Self Care | Payer: BLUE CROSS/BLUE SHIELD | Source: Ambulatory Visit | Attending: Internal Medicine | Admitting: Internal Medicine

## 2015-09-28 ENCOUNTER — Telehealth: Payer: Self-pay | Admitting: Internal Medicine

## 2015-09-28 ENCOUNTER — Telehealth: Payer: Self-pay | Admitting: Pulmonary Disease

## 2015-09-28 ENCOUNTER — Other Ambulatory Visit: Payer: BLUE CROSS/BLUE SHIELD

## 2015-09-28 ENCOUNTER — Ambulatory Visit (INDEPENDENT_AMBULATORY_CARE_PROVIDER_SITE_OTHER): Payer: BLUE CROSS/BLUE SHIELD | Admitting: Internal Medicine

## 2015-09-28 ENCOUNTER — Other Ambulatory Visit (INDEPENDENT_AMBULATORY_CARE_PROVIDER_SITE_OTHER): Payer: BLUE CROSS/BLUE SHIELD

## 2015-09-28 ENCOUNTER — Encounter: Payer: Self-pay | Admitting: Internal Medicine

## 2015-09-28 VITALS — BP 140/80 | HR 80 | Temp 97.9°F | Ht 69.0 in | Wt 190.0 lb

## 2015-09-28 DIAGNOSIS — I7121 Aneurysm of the ascending aorta, without rupture: Secondary | ICD-10-CM | POA: Insufficient documentation

## 2015-09-28 DIAGNOSIS — J342 Deviated nasal septum: Secondary | ICD-10-CM | POA: Insufficient documentation

## 2015-09-28 DIAGNOSIS — R05 Cough: Secondary | ICD-10-CM | POA: Insufficient documentation

## 2015-09-28 DIAGNOSIS — R079 Chest pain, unspecified: Secondary | ICD-10-CM | POA: Insufficient documentation

## 2015-09-28 DIAGNOSIS — I712 Thoracic aortic aneurysm, without rupture: Secondary | ICD-10-CM | POA: Diagnosis not present

## 2015-09-28 DIAGNOSIS — R0602 Shortness of breath: Secondary | ICD-10-CM | POA: Diagnosis not present

## 2015-09-28 DIAGNOSIS — J328 Other chronic sinusitis: Secondary | ICD-10-CM | POA: Insufficient documentation

## 2015-09-28 DIAGNOSIS — K760 Fatty (change of) liver, not elsewhere classified: Secondary | ICD-10-CM | POA: Diagnosis not present

## 2015-09-28 DIAGNOSIS — J341 Cyst and mucocele of nose and nasal sinus: Secondary | ICD-10-CM | POA: Diagnosis not present

## 2015-09-28 DIAGNOSIS — J4530 Mild persistent asthma, uncomplicated: Secondary | ICD-10-CM

## 2015-09-28 DIAGNOSIS — R058 Other specified cough: Secondary | ICD-10-CM

## 2015-09-28 LAB — BASIC METABOLIC PANEL WITH GFR
BUN: 13 mg/dL (ref 6–23)
CO2: 29 meq/L (ref 19–32)
Calcium: 10 mg/dL (ref 8.4–10.5)
Chloride: 102 meq/L (ref 96–112)
Creatinine, Ser: 0.75 mg/dL (ref 0.40–1.20)
GFR: 82.36 mL/min
Glucose, Bld: 111 mg/dL — ABNORMAL HIGH (ref 70–99)
Potassium: 3.7 meq/L (ref 3.5–5.1)
Sodium: 140 meq/L (ref 135–145)

## 2015-09-28 LAB — D-DIMER, QUANTITATIVE: D-Dimer, Quant: 0.54 ug/mL-FEU — ABNORMAL HIGH (ref 0.00–0.48)

## 2015-09-28 MED ORDER — TRAMADOL HCL 50 MG PO TABS: ORAL_TABLET | ORAL | Status: DC

## 2015-09-28 MED ORDER — IOHEXOL 350 MG/ML SOLN
100.0000 mL | Freq: Once | INTRAVENOUS | Status: AC | PRN
  Administered 2015-09-28: 100 mL via INTRAVENOUS

## 2015-09-28 NOTE — Assessment & Plan Note (Signed)
cta chest 09/28/2015 > No demonstrable pulmonary embolus. Lungs clear. Maximum transverse diameter of the at ascending thoracic aorta is 4.1 x 4.0 cm. Recommend annual imaging followup by CTA or MRA.  About the CT scan was important because I didn't have an expiration or dyspnea and because she has classic pleuritic pain involving the right shoulder and flank but is unexplained but probably in retrospect present represents musculoskeletal pain. The d-dimer at upper limits of normal does not exclude small peripheral emboli but I think clinically this is unlikely and recommended attention directed at controlling both cough, throat clearly and what is probably musculoskelatal Chest pain with Ultram

## 2015-09-28 NOTE — Assessment & Plan Note (Signed)
Spirometry 05/12/15 abn f/v loop  - PFT's while asymptomatic 07/12/2015  FEV1  2.36 (82%) ratio 67 and no better p saba and dlco 79% corrects to 85%  - Singulair trial 07/12/2015 > notified 08/25/2015 not filling   I could not detect any significant asthma today but noted if it's present at all it certainly does not correlate well with her present symptoms/ exam in that she actually worsened while on prednisone which is very strongly against both asthma and allergy as a driving mechanism here. For now I've advised to stay on Qvar 80 those one puff twice daily and will check on return why she is not refilling her singulair  The proper method of use, as well as anticipated side effects, of a metered-dose inhaler are discussed and demonstrated to the patient. Improved effectiveness after extensive coaching during this visit to a level of approximately  90%

## 2015-09-28 NOTE — Telephone Encounter (Signed)
Spoke with pt. Reports increased SOB, chest tightness and wheezing. Denies coughing or fever. Onset was 2 weeks ago. Saw her PCP for an asthma flare up and was given prednisone but since stopped due to increased insomnia. Feels like she can't get a full deep breath in. Would like MW's recommendations.  MW - please advise. Thanks.

## 2015-09-28 NOTE — Telephone Encounter (Signed)
Dg Chest 2 View  09/28/2015  CLINICAL DATA:  Right posterior chest pain and shortness of breath for the past 2 days, recent episode of bronchitis, history of asthma and previous episodes of pneumonia, remote history of smoking EXAM: CHEST  2 VIEW COMPARISON:  PA and lateral chest x-ray of May 17, 2015 FINDINGS: The lungs are well-expanded. There is no focal infiltrate. There is no pleural effusion. The heart and pulmonary vascularity are normal. The mediastinum is normal in width. The trachea is midline. There is no pneumothorax or pneumomediastinum. The bony thorax exhibits no acute abnormality. IMPRESSION: There is no active cardiopulmonary disease. Electronically Signed   By: David  SwazilandJordan M.D.   On: 09/28/2015 14:21   Ct Angio Chest Pe W/cm &/or Wo Cm  09/28/2015  CLINICAL DATA:  Three-day history of shortness of breath EXAM: CT ANGIOGRAPHY CHEST WITH CONTRAST TECHNIQUE: Multidetector CT imaging of the chest was performed using the standard protocol during bolus administration of intravenous contrast. Multiplanar CT image reconstructions and MIPs were obtained to evaluate the vascular anatomy. CONTRAST:  100mL OMNIPAQUE IOHEXOL 350 MG/ML SOLN COMPARISON:  Chest radiograph September 28, 2015 FINDINGS: There is no demonstrable pulmonary embolus. The ascending thoracic aorta is prominent measuring 4.1 x 4.0 cm in transverse diameter. No dissection seen. Visualized great vessels appear unremarkable. There is no edema or consolidation.  Lungs are clear. Thyroid appears unremarkable. There is no appreciable thoracic adenopathy. Pericardium is not thickened. There is no demonstrable coronary artery calcification on this study. In the visualized upper abdomen, there is a degree of hepatic steatosis. Visualized upper abdominal structures otherwise appear unremarkable. There are no blastic or lytic bone lesions. Review of the MIP images confirms the above findings. IMPRESSION: No demonstrable pulmonary embolus.   Lungs clear. Maximum transverse diameter of the at ascending thoracic aorta is 4.1 x 4.0 cm. Recommend annual imaging followup by CTA or MRA. This recommendation follows 2010 ACCF/AHA/AATS/ACR/ASA/SCA/SCAI/SIR/STS/SVM Guidelines for the Diagnosis and Management of Patients with Thoracic Aortic Disease. Circulation. 2010; 121: [id number] No appreciable adenopathy. Hepatic steatosis is present. Electronically Signed   By: Bretta BangWilliam  Woodruff III M.D.   On: 09/28/2015 17:58     No PE.  Has enlarged TAA.  Advised radiology staff that it is okay to d/c patient home.  Will route to Dr. Sherene SiresWert.

## 2015-09-28 NOTE — Assessment & Plan Note (Signed)
-   trial rx for cyclical cough 05/18/2015 > resolved.  - trial of singulair 10 mg q pm 07/12/2015 >>>  - sinus ct 09/28/2015 >>>   I still strongly favor upper airway cough syndrome over cough variant asthma but is very hard tell these to apart. I suspect what's happened now she has coughed so hard she has developed musculoskeletal chest pain from coughing and is still vigorously clearing her throat although denies she is actually coughing. I recommended a trial of Ultram in addition to continued maximum reflux treatment and await the results of sinus CT scan unavailable  I had an extended discussion with the patient reviewing all relevant studies completed to date and  lasting 25minutes of a 40minute acute extended (due to cp)  visit    Each maintenance medication was reviewed in detail including most importantly the difference between maintenance and prns and under what circumstances the prns are to be triggered using an action plan format that is not reflected in the computer generated alphabetically organized AVS.    Please see instructions for details which were reviewed in writing and the patient given a copy highlighting the part that I personally wrote and discussed at today's ov.

## 2015-09-28 NOTE — Assessment & Plan Note (Signed)
The treatment for this is medical and note that she is a very high strung patient with upper limits of normal blood pressure so we will go ahead and start her on low-dose beta blockers. Strongly prefer in this setting: Bystolic, the most beta -1  selective Beta blocker available in sample form, with bisoprolol the most selective generic choice  on the market.   rec bisoprolol 2.5 mg  And Will refer to cards for longterm f/u

## 2015-09-28 NOTE — Telephone Encounter (Signed)
Ov with all active meds in hand, ok to add on to this afternoon if Tammy NP can't see sooner, nothing to rec over the phone, but if not comfortable at rest and can't wait for ov go to er

## 2015-09-28 NOTE — Patient Instructions (Addendum)
Please see patient coordinator before you leave today  to schedule ct of sinus and chest asap  For pain or cough/ throat clearing > ultram 50 mg 1-2 every 4 hours as needed  qvar 80 is one twice daily   Only use your albuterol as a rescue medication to be used if you can't catch your breath by resting or doing a relaxed purse lip breathing pattern.  - The less you use it, the better it will work when you need it. - Ok to use up to 2 puffs  every 4 hours if you must but call for immediate appointment if use goes up over your usual need - Don't leave home without it !!  (think of it like the spare tire for your car)   Please schedule a follow up office visit in 2 weeks, sooner if needed  -late add notified 08/25/2015 not filling  Singulair, needs allergy profile on return

## 2015-09-28 NOTE — Telephone Encounter (Signed)
Pt has been scheduled to see MW today at 1:30pm. Nothing further was needed.

## 2015-09-28 NOTE — Progress Notes (Signed)
Subjective:    Patient ID: [name], female    DOB: 1951/02/11    MRN: [id number]    Brief patient profile:  [age] yowf never regular smoker [place] where in 3rd grade  bad pna /prolonged admit and felt like recovered but since young adult tendency to head colds to end up in chest with worsening severity of cough > vomit with 2 episodes in 2016 the first in March 2016  and again in May 2016  referred by Dr Foy GuadalajaraFried for recurrent cough with evidence of airflow obst on office spirometry 05/12/15 and pfts done 07/12/2015 c/w very mild chronic airflow obst with  Non reversible component.    History of Present Illness  05/17/2015 1st [facility] office visit/ Dewain Platz   Chief Complaint  Patient presents with  . Pulmonary Consult    Referred by Dr. Foy GuadalajaraFried. Pt c/o cough "for years" worse for the past 2 yrs with wheezing,    tends to cough/ clear throat sev times a day/ sometimes at hs but not typically first thing in am and usually nonproductive, hears herself wheeze When cough worse takes codeine and lots of cough drops / sometimes abx/ pred help/ assoc with intermittent HB/ takes prn ppi dulera tried/shaking and cough no better  rec Please remember to go to the x-ray department downstairs for your tests - we will call you with the results when they are available. Change prilosec 40 mg  Take  30-60 min before first meal of the day and Pepcid (famotidine)  20 mg one @  bedtime until return to office - this is the best way to tell whether stomach acid is contributing to your problem.   GERD  Diet  As needed ok to use xopenex hfa  Up to 2 pffs every 4 hours for breathing or coughing problems     07/12/2015 f/u ov/Bassy Fetterly re: mild persistent asthma  Chief Complaint  Patient presents with  . Follow-up    PFT done today. Pt states her cough has improved some. No new co's today.   was taking acid suppression prior to the last flare and had same severe head cold to chest symptoms  progression  Presently no symptoms and no need for xopenex at all  rec singulair 10 mg one daily typically taken in evening automatically  Please schedule a follow up visit in 6 months but call sooner if needed  If continues to flare needs sinus ct/ allergy profile and trial of qvar maint rx    09/28/2015  Acute extended  ov/Edna Grover re:  Cough/sob  Despite  despite saba/qvar/ pred rx and new R pleuritic cp Chief Complaint  Patient presents with  . Acute Visit    Pt states developed "cold" that turned into bronchitis approx 2 wks ago- she was prescribed pred taper, qvar, and proair.  She c/o SOB, nausea, chest tightness, and insomnia.  acute onset oct 12th felt onset head cold from multiple fm members sick > chest cold > eval 09/23/15 > rx pred/qvar 80 one bid  /proair Stopped pred on 09/26/15 with onset R shoulder and R lat chest 09/26/15 / worse sob/cp  lying down  Cough better but still urge to clear throat, some green mucus ? pnds saba not really  helping sob   No obvious day to day or daytime variability or assoc subjective wheeze or overt sinus or hb symptoms on prilosec 40 mg qd ac. No unusual exp hx or h/o childhood pna/ asthma  or knowledge of premature birth.  Sleeping ok without nocturnal  or early am exacerbation  of respiratory  c/o's or need for noct saba. Also denies any obvious fluctuation of symptoms with weather or environmental changes or other aggravating or alleviating factors except as outlined above   Current Medications, Allergies, Complete Past Medical History, Past Surgical History, Family History, and Social History were reviewed in [employer] record.  ROS  The following are not active complaints unless bolded sore throat, dysphagia, dental problems, itching, sneezing,  nasal congestion or excess/ purulent secretions, ear ache,   fever, chills, sweats, unintended wt loss, classically pleuritic or exertional cp, hemoptysis,  orthopnea pnd or leg  swelling, presyncope, palpitations, abdominal pain, anorexia, nausea, vomiting, diarrhea  or change in bowel or bladder habits, change in stools or urine, dysuria,hematuria,  rash, arthralgias, visual complaints, headache, numbness, weakness or ataxia or problems with walking or coordination,  change in mood/affect or memory.               Objective:   Physical Exam  Pleasant but very anxious amb wf nad jumping from one complaint to another in rapid succession.       Wt Readings from Last 3 Encounters:  09/28/15 190 lb (86.183 kg)  07/12/15 190 lb (86.183 kg)  05/17/15 191 lb 6.4 oz (86.818 kg)    Vital signs reviewed     HEENT: nl dentition, turbinates, and orophanx. Nl external ear canals without cough reflex   NECK :  without JVD/Nodes/TM/ nl carotid upstrokes bilaterally   LUNGS: no acc muscle use, clear to A and P bilaterally without cough on insp or exp maneuvers   CV:  RRR  no s3 or murmur or increase in P2, no edema   ABD:  soft and nontender with nl excursion in the supine position. No bruits or organomegaly, bowel sounds nl  MS:  warm without deformities, calf tenderness, cyanosis or clubbing  SKIN: warm and dry without lesions    NEURO:  alert, approp, no deficits   Labs ordered 09/28/2015  Lab Results  Component Value Date   DDIMER 0.54* 09/28/2015    CXR PA and Lateral:   09/28/2015 :    I personally reviewed images and agree with radiology impression as follows:    wnl  CTa chest 09/28/2015  No PE/ nl lungs TAA x 4.0 cm      Assessment & Plan:

## 2015-09-29 ENCOUNTER — Other Ambulatory Visit: Payer: BLUE CROSS/BLUE SHIELD

## 2015-09-29 ENCOUNTER — Other Ambulatory Visit: Payer: Self-pay | Admitting: Internal Medicine

## 2015-09-29 ENCOUNTER — Inpatient Hospital Stay: Admission: RE | Admit: 2015-09-29 | Payer: BLUE CROSS/BLUE SHIELD | Source: Ambulatory Visit

## 2015-09-29 DIAGNOSIS — R05 Cough: Secondary | ICD-10-CM

## 2015-09-29 DIAGNOSIS — R058 Other specified cough: Secondary | ICD-10-CM

## 2015-09-29 MED ORDER — AMOXICILLIN-POT CLAVULANATE 875-125 MG PO TABS
1.0000 | ORAL_TABLET | Freq: Two times a day (BID) | ORAL | Status: DC
Start: 2015-09-29 — End: 2015-10-21

## 2015-09-29 NOTE — Progress Notes (Signed)
Quick Note:  See phone note from 10.27.16. ______

## 2015-09-29 NOTE — Progress Notes (Signed)
Quick Note:  Spoke with pt and notified of results per Dr. Wert. Pt verbalized understanding and denied any questions.  ______ 

## 2015-09-29 NOTE — Progress Notes (Signed)
Quick Note:  See phone note from 10.27.16. ______ 

## 2015-09-29 NOTE — Progress Notes (Signed)
Quick Note:  Pt aware ______ 

## 2015-09-29 NOTE — Telephone Encounter (Signed)
Called and spoke to pt. Pt stated she is returning the call to Dr. Sherene SiresWert regarding results. Advised pt I can relay the results per Dr. Sherene SiresWert, pt declined and requested she only speak with Dr. Sherene SiresWert because she has concerns. Pt can be reached at [phone] and she is available all day today.   Dr. Sherene SiresWert please advise. Thanks.   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 It would help reduce the usual side effects (diarrhea and yeast infections) if you ate cultured yogurt at lunch. > needs repeat sinus ct in 3 weeks with ov the same day to regroup

## 2015-09-29 NOTE — Telephone Encounter (Signed)
Dr Sherene SiresWert has already called and discussed results with the pt  I called and verified her pharm and augmentin was called in  Appt 10/21/15 and [facility] sinus to be repeated same day

## 2015-09-29 NOTE — Telephone Encounter (Signed)
Patient is returning Dr. Thurston HoleWert's call from this morning, [phone].

## 2015-10-12 ENCOUNTER — Ambulatory Visit: Payer: BLUE CROSS/BLUE SHIELD | Admitting: Internal Medicine

## 2015-10-21 ENCOUNTER — Ambulatory Visit (INDEPENDENT_AMBULATORY_CARE_PROVIDER_SITE_OTHER)
Admission: RE | Admit: 2015-10-21 | Discharge: 2015-10-21 | Disposition: A | Discharge status: Home / Self Care | Payer: BLUE CROSS/BLUE SHIELD | Source: Ambulatory Visit | Attending: Internal Medicine | Admitting: Internal Medicine

## 2015-10-21 ENCOUNTER — Encounter: Payer: Self-pay | Admitting: Internal Medicine

## 2015-10-21 ENCOUNTER — Ambulatory Visit (INDEPENDENT_AMBULATORY_CARE_PROVIDER_SITE_OTHER): Payer: BLUE CROSS/BLUE SHIELD | Admitting: Internal Medicine

## 2015-10-21 VITALS — BP 118/80 | HR 83 | Ht 69.0 in | Wt 190.0 lb

## 2015-10-21 DIAGNOSIS — R05 Cough: Secondary | ICD-10-CM | POA: Diagnosis not present

## 2015-10-21 DIAGNOSIS — I7121 Aneurysm of the ascending aorta, without rupture: Secondary | ICD-10-CM

## 2015-10-21 DIAGNOSIS — R058 Other specified cough: Secondary | ICD-10-CM

## 2015-10-21 DIAGNOSIS — J4530 Mild persistent asthma, uncomplicated: Secondary | ICD-10-CM

## 2015-10-21 DIAGNOSIS — I712 Thoracic aortic aneurysm, without rupture: Secondary | ICD-10-CM

## 2015-10-21 NOTE — Progress Notes (Signed)
Subjective:    Patient ID: [name], female    DOB: 07-30-1951    MRN: [id number]    Brief patient profile:  [age] yowf never regular smoker lived in [place] where in 3rd grade  bad pna /prolonged admit and felt like recovered but since young adult tendency to head colds to end up in chest with worsening severity of cough > vomit with 2 episodes in 2016 the first in March 2016  and again in May 2016  referred by Dr Foy GuadalajaraFried for recurrent cough with evidence of airflow obst on office spirometry 05/12/15 and pfts done 07/12/2015 c/w very mild chronic airflow obst with  Non reversible component.    History of Present Illness  05/17/2015 1st [name]/ [name]   Chief Complaint  Patient presents with  . Pulmonary Consult    Referred by Dr. Foy GuadalajaraFried. Pt c/o cough "for years" worse for the past 2 yrs with wheezing,    tends to cough/ clear throat sev times a day/ sometimes at hs but not typically first thing in am and usually nonproductive, hears herself wheeze When cough worse takes codeine and lots of cough drops / sometimes abx/ pred help/ assoc with intermittent HB/ takes prn ppi dulera tried/shaking and cough no better  rec Please remember to go to the x-ray department downstairs for your tests - we will call you with the results when they are available. Change prilosec 40 mg  Take  30-60 min before first meal of the day and Pepcid (famotidine)  20 mg one @  bedtime until return to office - this is the best way to tell whether stomach acid is contributing to your problem.   GERD  Diet  As needed ok to use xopenex hfa  Up to 2 pffs every 4 hours for breathing or coughing problems     07/12/2015 f/u ov/[name] re: mild persistent asthma  Chief Complaint  Patient presents with  . Follow-up    PFT done today. Pt states her cough has improved some. No new co's today.   was taking acid suppression prior to the last flare and had same severe head cold to chest symptoms  progression  Presently no symptoms and no need for xopenex at all  rec singulair 10 mg one daily typically taken in evening automatically  Please schedule a follow up visit in 6 months but call sooner if needed  If continues to flare needs sinus ct/ allergy profile and trial of qvar maint rx    09/28/2015  Acute extended  ov/[name] re:  Cough/sob  Despite  despite saba/qvar/ pred rx and new R pleuritic cp Chief Complaint  Patient presents with  . Acute Visit    Pt states developed "cold" that turned into bronchitis approx 2 wks ago- she was prescribed pred taper, qvar, and proair.  She c/o SOB, nausea, chest tightness, and insomnia.  acute onset oct 12th felt onset head cold from multiple fm members sick > chest cold > eval 09/23/15 > rx pred/qvar 80 one bid  /proair Stopped pred on 09/26/15 with onset R shoulder and R lat chest 09/26/15 / worse sob/cp  lying down  Cough better but still urge to clear throat, some green mucus ? pnds saba not really  helping sob rec Please see patient coordinator before you leave today  to schedule ct of sinus and chest asap> a/f level > augmentin x 10 days For pain or cough/ throat clearing > ultram 50 mg  1-2 every 4 hours as needed qvar 80 is one twice daily  Only use your albuterol as a rescue medication  -late add notified 08/25/2015 not filling  Singulair     10/21/2015  f/u ov/[name] re: asthma with prominent cough / ? uacs related to sinusitis  Chief Complaint  Patient presents with  . Follow-up    pt following for upper airway cough syndrome: pt states she is doing much better. pt states she still has a little dry cough but much better.  no c/o SOB, wheezing, or chest tightness. Pt has CT of sinus this morning.    Not limited by breathing from desired activities    No need for saba    No obvious day to day or daytime variability or assoc subjective wheeze or overt sinus or hb symptoms on prilosec 40 mg qd ac. No unusual exp hx or h/o childhood  pna/ asthma or knowledge of premature birth.  Sleeping ok without nocturnal  or early am exacerbation  of respiratory  c/o's or need for noct saba. Also denies any obvious fluctuation of symptoms with weather or environmental changes or other aggravating or alleviating factors except as outlined above   Current Medications, Allergies, Complete Past Medical History, Past Surgical History, Family History, and Social History were reviewed in [employer] record.  ROS  The following are not active complaints unless bolded sore throat, dysphagia, dental problems, itching, sneezing,  nasal congestion or excess/ purulent secretions, ear ache,   fever, chills, sweats, unintended wt loss, classically pleuritic or exertional cp, hemoptysis,  orthopnea pnd or leg swelling, presyncope, palpitations, abdominal pain, anorexia, nausea, vomiting, diarrhea  or change in bowel or bladder habits, change in stools or urine, dysuria,hematuria,  rash, arthralgias, visual complaints, headache, numbness, weakness or ataxia or problems with walking or coordination,  change in mood/affect or memory.               Objective:   Physical Exam  Pleasant but very anxious amb wf nad     10/21/2015    190    09/28/15 190 lb (86.183 kg)  07/12/15 190 lb (86.183 kg)  05/17/15 191 lb 6.4 oz (86.818 kg)    Vital signs reviewed     HEENT: nl dentition, turbinates, and orophanx. Nl external ear canals without cough reflex   NECK :  without JVD/Nodes/TM/ nl carotid upstrokes bilaterally   LUNGS: no acc muscle use, clear to A and P bilaterally without cough on insp or exp maneuvers   CV:  RRR  no s3 or murmur or increase in P2, no edema   ABD:  soft and nontender with nl excursion in the supine position. No bruits or organomegaly, bowel sounds nl  MS:  warm without deformities, calf tenderness, cyanosis or clubbing  SKIN: warm and dry without lesions    NEURO:  alert, approp, no deficits        CTa chest 09/28/2015  No PE/ nl lungs TAA x 4.0 cm      Assessment & Plan:

## 2015-10-21 NOTE — Patient Instructions (Addendum)
Ok to leave off qvar and singulair but at onset of resp flare > first step is qvar 2 every 12 hours and if not improving after a week restart singulair   For drippy nose, itching / sneezing > zyrtec  We will contact you  In October 2017 re f/u CT chest    If you are satisfied with your treatment plan,  let your doctor know and he/she can either refill your medications or you can return here when your prescription runs out.     If in any way you are not 100% satisfied,  please tell us.  If 100% better, tell your friends!  Pulmonary follow up is as needed

## 2015-10-22 ENCOUNTER — Encounter: Payer: Self-pay | Admitting: Internal Medicine

## 2015-10-22 NOTE — Assessment & Plan Note (Addendum)
Spirometry 05/12/15 abn f/v loop  - PFT's while asymptomatic 07/12/2015  FEV1  2.36 (82%) ratio 67 and no better p saba and dlco 79% corrects to 85%  - Singulair trial 07/12/2015 > notified 08/25/2015 not filling   Patient denies missing doses of Singulair but clearly she is not filling and so we be very careful when we attribute improvement or lack thereof based on what she reports she is taking going forward using a trust but verify approach

## 2015-10-22 NOTE — Assessment & Plan Note (Addendum)
-   trial rx for cyclical cough 05/18/2015 > resolved.  - trial of singulair 10 mg q pm 07/12/2015 >>>  - sinus ct 09/28/2015 > Multifocal paranasal sinus disease. Air-fluid level in the left maxillary antrum. Mucosal thickening is most pronounced in the right maxillary antrum as well as in multiple ethmoid air cells on the right. Note that the aerated right frontal is opacified. There is a retention cyst in the left sphenoid sinus region> rec Augmentin 875 mg take one pill twice daily  X20 days  > then repeat CT Sinus done 10/21/2015 with resolution of a/f levels     She is convinced she is better and does not want to commit to long-term therapy for now.  This is  perfectly reasonable because she only has mild disease at best so is fine with me to stop both the Qvar and Singulair as long as she remembers to immediately start them with the onset of any rhinitis or cough symptoms.  I had an extended discussion with the patient reviewing all relevant studies completed to date and  lasting 15 to 20 minutes of a 25 minute visit    Each maintenance medication was reviewed in detail including most importantly the difference between maintenance and prns and under what circumstances the prns are to be triggered using an action plan format that is not reflected in the computer generated alphabetically organized AVS.    Please see instructions for details which were reviewed in writing and the patient given a copy highlighting the part that I personally wrote and discussed at today's ov.

## 2015-10-22 NOTE — Assessment & Plan Note (Signed)
See CT chest 09/28/2015 x 4.0 cm - rec bisoprolol 2.5 mg daily 09/28/2015 > never took it - in computer for recall 09/27/16    Her blood pressure is adequate without beta blockers although it is not clear to me why she did not start the bisoprolol was recommended. We have her in for follow-up in October 2017 for the nodules. 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 After that point, if the nodules no longer needs serial CT follow-up but the aortic size needs f/u , she'll need to be referred to cardiology for this purpose

## 2015-12-13 ENCOUNTER — Other Ambulatory Visit: Payer: Self-pay | Admitting: Obstetrics and Gynecology

## 2015-12-15 ENCOUNTER — Other Ambulatory Visit: Payer: Self-pay | Admitting: Obstetrics and Gynecology

## 2015-12-15 DIAGNOSIS — E2839 Other primary ovarian failure: Secondary | ICD-10-CM

## 2015-12-16 LAB — CYTOLOGY - PAP

## 2015-12-28 ENCOUNTER — Telehealth: Payer: Self-pay | Admitting: Internal Medicine

## 2015-12-28 NOTE — Telephone Encounter (Signed)
Patient says she is coming down with virus symptoms, she has tickle in lungs causing her to cough up a little bit of white phlegm.  She said that last time she had an asthma attack, she coughed up a lot of white phlegm.  She is feeling a little "run down" today.  She would like to know what Dr. Sherene Sires recommends to prevent her from having a full blown asthma attack?    [facility]  Allergies  Allergen Reactions  . Dulera [Mometasone Furo-Formoterol Fum] Palpitations

## 2015-12-28 NOTE — Telephone Encounter (Signed)
Called and spoke to pt. Informed her of the recs per MW. Pt verbalized understanding and denied any further questions or concerns at this time.   

## 2015-12-28 NOTE — Telephone Encounter (Signed)
Read her back last  last instructions but most impt was  Ok to leave off qvar and singulair but at onset of resp flare > first step is qvar 2 every 12 hours and if not improving after a week restart singulair   For drippy nose, itching / sneezing > zyrtec  No abx or pred for now, ov by w/e if needed

## 2016-01-09 ENCOUNTER — Ambulatory Visit (INDEPENDENT_AMBULATORY_CARE_PROVIDER_SITE_OTHER): Payer: BLUE CROSS/BLUE SHIELD | Admitting: Internal Medicine

## 2016-01-09 ENCOUNTER — Encounter: Payer: Self-pay | Admitting: Internal Medicine

## 2016-01-09 VITALS — BP 112/70 | HR 68 | Ht 69.0 in | Wt 190.0 lb

## 2016-01-09 DIAGNOSIS — R058 Other specified cough: Secondary | ICD-10-CM

## 2016-01-09 DIAGNOSIS — R05 Cough: Secondary | ICD-10-CM | POA: Diagnosis not present

## 2016-01-09 DIAGNOSIS — Z23 Encounter for immunization: Secondary | ICD-10-CM

## 2016-01-09 DIAGNOSIS — J4530 Mild persistent asthma, uncomplicated: Secondary | ICD-10-CM

## 2016-01-09 MED ORDER — BECLOMETHASONE DIPROPIONATE 80 MCG/ACT IN AERS
INHALATION_SPRAY | RESPIRATORY_TRACT | Status: DC
Start: 2016-01-09 — End: 2016-01-09

## 2016-01-09 MED ORDER — BECLOMETHASONE DIPROPIONATE 80 MCG/ACT IN AERS: INHALATION_SPRAY | RESPIRATORY_TRACT | Status: DC

## 2016-01-09 NOTE — Patient Instructions (Addendum)
Resume qvar 80 one twice daily and at onset of any respiratory flare increase 2 every 12 hours   For drippy nose, itching / sneezing/ tickle > zyrtec and not effective ok to try For drainage / throat tickle try take CHLORPHENIRAMINE  4 mg - take one every 4 hours as needed - available over the counter- may cause drowsiness so start with just a bedtime dose or two and see how you tolerate it before trying in daytime    Prevar 13 today   We will see you back in October after your CT, call sooner if needed

## 2016-01-09 NOTE — Progress Notes (Signed)
Subjective:    Patient ID: Joanne SorrowMary H Howard, female    DOB: 07-30-1951    MRN: [id number]    Brief patient profile:  [age] yowf never regular smoker [place] in 3rd grade  bad pna /prolonged admit and felt like recovered but since young adult tendency to head colds to end up in chest with worsening severity of cough > vomit with 2 episodes in 2016 the first in March 2016  and again in May 2016  referred by Dr Foy GuadalajaraFried for recurrent cough with evidence of airflow obst on office spirometry 05/12/15 and pfts done 07/12/2015 c/w very mild chronic airflow obst with  Non reversible component.    History of Present Illness  05/17/2015 1st [name]/ Donnah Levert   Chief Complaint  Patient presents with  . Pulmonary Consult    Referred by Dr. Foy GuadalajaraFried. Pt c/o cough "for years" worse for the past 2 yrs with wheezing,    tends to cough/ clear throat sev times a day/ sometimes at hs but not typically first thing in am and usually nonproductive, hears herself wheeze When cough worse takes codeine and lots of cough drops / sometimes abx/ pred help/ assoc with intermittent HB/ takes prn ppi dulera tried/shaking and cough no better  rec Please remember to go to the x-ray department downstairs for your tests - we will call you with the results when they are available. Change prilosec 40 mg  Take  30-60 min before first meal of the day and Pepcid (famotidine)  20 mg one @  bedtime until return to office - this is the best way to tell whether stomach acid is contributing to your problem.   GERD  Diet  As needed ok to use xopenex hfa  Up to 2 pffs every 4 hours for breathing or coughing problems     07/12/2015 f/u ov/Supreme Rybarczyk re: mild persistent asthma  Chief Complaint  Patient presents with  . Follow-up    PFT done today. Pt states her cough has improved some. No new co's today.   was taking acid suppression prior to the last flare and had same severe head cold to chest symptoms  progression  Presently no symptoms and no need for xopenex at all  rec singulair 10 mg one daily typically taken in evening automatically  Please schedule a follow up visit in 6 months but call sooner if needed  If continues to flare needs sinus ct/ allergy profile and trial of qvar maint rx    09/28/2015  Acute extended  ov/Lajarvis Italiano re:  Cough/sob  Despite  despite saba/qvar/ pred rx and new R pleuritic cp Chief Complaint  Patient presents with  . Acute Visit    Pt states developed "cold" that turned into bronchitis approx 2 wks ago- she was prescribed pred taper, qvar, and proair.  She c/o SOB, nausea, chest tightness, and insomnia.  acute onset oct 12th felt onset head cold from multiple fm members sick > chest cold > eval 09/23/15 > rx pred/qvar 80 one bid  /proair Stopped pred on 09/26/15 with onset R shoulder and R lat chest 09/26/15 / worse sob/cp  lying down  Cough better but still urge to clear throat, some green mucus ? pnds saba not really  helping sob rec Please see patient coordinator before you leave today  to schedule ct of sinus and chest asap> a/f level > augmentin x 10 days For pain or cough/ throat clearing > ultram 50 mg  1-2 every 4 hours as needed qvar 80 is one twice daily  Only use your albuterol as a rescue medication  -late add notified 08/25/2015 not filling  Singulair     10/21/2015  f/u ov/Ming Kunka re: asthma with prominent cough / ? uacs related to sinusitis  Chief Complaint  Patient presents with  . Follow-up    pt following for upper airway cough syndrome: pt states she is doing much better. pt states she still has a little dry cough but much better.  no c/o SOB, wheezing, or chest tightness. Pt has CT of sinus this morning.  Not limited by breathing from desired activities   rec Ok to leave off qvar and singulair but at onset of resp flare > first step is qvar 2 every 12 hours and if not improving after a week restart singulair  For drippy nose, itching /  sneezing > zyrtec   01/09/2016  f/u ov/Randie Bloodgood ZO:XWRUE variant asthma  better ? From qvar / has not tried zyrtec  Chief Complaint  Patient presents with  . Follow-up    Pt states developed a cold a few wks ago and had increased cough for 1 wk. She is feeling well today and is not coughing much.    No obvious day to day or daytime variability or assoc excess/ purulent sputum or mucus plugs   subjective wheeze or overt sinus or hb symptoms on prilosec 40 mg qd ac. No unusual exp hx or h/o childhood pna/ asthma or knowledge of premature birth.  Sleeping ok without nocturnal  or early am exacerbation  of respiratory  c/o's or need for noct saba. Also denies any obvious fluctuation of symptoms with weather or environmental changes or other aggravating or alleviating factors except as outlined above   Current Medications, Allergies, Complete Past Medical History, Past Surgical History, Family History, and Social History were reviewed in [employer] record.  ROS  The following are not active complaints unless bolded sore throat, dysphagia, dental problems, itching, sneezing,  nasal congestion or excess/ purulent secretions, ear ache,   fever, chills, sweats, unintended wt loss, classically pleuritic or exertional cp, hemoptysis,  orthopnea pnd or leg swelling, presyncope, palpitations, abdominal pain, anorexia, nausea, vomiting, diarrhea  or change in bowel or bladder habits, change in stools or urine, dysuria,hematuria,  rash, arthralgias, visual complaints, headache, numbness, weakness or ataxia or problems with walking or coordination,  change in mood/affect or memory.               Objective:   Physical Exam  Pleasant but very anxious amb wf nad     10/21/2015    190  > 01/09/2016    190      09/28/15 190 lb (86.183 kg)  07/12/15 190 lb (86.183 kg)  05/17/15 191 lb 6.4 oz (86.818 kg)    Vital signs reviewed     HEENT: nl dentition, turbinates, and orophanx. Nl  external ear canals without cough reflex   NECK :  without JVD/Nodes/TM/ nl carotid upstrokes bilaterally   LUNGS: no acc muscle use, clear to A and P bilaterally without cough on insp or exp maneuvers   CV:  RRR  no s3 or murmur or increase in P2, no edema   ABD:  soft and nontender with nl excursion in the supine position. No bruits or organomegaly, bowel sounds nl  MS:  warm without deformities, calf tenderness, cyanosis or clubbing  SKIN: warm and dry without lesions  NEURO:  alert, approp, no deficits      CTa chest 09/28/2015  No PE/ nl lungs TAA x 4.0 cm      Assessment & Plan:

## 2016-01-15 NOTE — Assessment & Plan Note (Signed)
-   trial rx for cyclical cough 05/18/2015 > resolved.  - trial of singulair 10 mg q pm 07/12/2015 >>> did not refill p one month - sinus ct 09/28/2015 > Multifocal paranasal sinus disease. Air-fluid level in the left maxillary antrum. Mucosal thickening is most pronounced in the right maxillary antrum as well as in multiple ethmoid air cells on the right. Note that the aerated right frontal is opacified. There is a retention cyst in the left sphenoid sinus region> rec Augmentin 875 mg take one pill twice daily  X20 days  > then repeat CT Sinus done 10/21/2015 with resolution of a/f levels   Did not follow instructions re using zyrtec but should at least try this for pnds and if ineffective then 1st gen H1 though warned re side effects  I had an extended discussion with the patient reviewing all relevant studies completed to date and  lasting 15 to 20 minutes of a 25 minute visit    Each maintenance medication was reviewed in detail including most importantly the difference between maintenance and prns and under what circumstances the prns are to be triggered using an action plan format that is not reflected in the computer generated alphabetically organized AVS.    Please see instructions for details which were reviewed in writing and the patient given a copy highlighting the part that I personally wrote and discussed at today's ov.

## 2016-01-15 NOTE — Assessment & Plan Note (Signed)
Spirometry 05/12/15 abn f/v loop  - PFT's while asymptomatic 07/12/2015  FEV1  2.36 (82%) ratio 67 and no better p saba and dlco 79% corrects to 85%  - Singulair trial 07/12/2015 > notified 08/25/2015 not filling   On qvar apparently able to tolerate uri without bad flare but needs to remember qvar is very mild and should double the dose to 80 x 2 every 12 hours at the very onset o anyflare  At present > All goals of chronic asthma control met including optimal function and elimination of symptoms with minimal need for rescue therapy.  Contingencies discussed in full including contacting this office immediately if not controlling the symptoms using the rule of two's.

## 2016-01-24 ENCOUNTER — Other Ambulatory Visit: Payer: BLUE CROSS/BLUE SHIELD

## 2016-02-07 ENCOUNTER — Ambulatory Visit
Admission: RE | Admit: 2016-02-07 | Discharge: 2016-02-07 | Disposition: A | Discharge status: Home / Self Care | Payer: BLUE CROSS/BLUE SHIELD | Source: Ambulatory Visit | Attending: Obstetrics and Gynecology | Admitting: Obstetrics and Gynecology

## 2016-02-07 DIAGNOSIS — E2839 Other primary ovarian failure: Secondary | ICD-10-CM

## 2016-09-07 ENCOUNTER — Other Ambulatory Visit: Payer: Self-pay | Admitting: Internal Medicine

## 2016-09-07 DIAGNOSIS — I7121 Aneurysm of the ascending aorta, without rupture: Secondary | ICD-10-CM

## 2016-09-07 DIAGNOSIS — I712 Thoracic aortic aneurysm, without rupture: Secondary | ICD-10-CM

## 2016-09-27 ENCOUNTER — Other Ambulatory Visit: Payer: BLUE CROSS/BLUE SHIELD

## 2016-10-01 ENCOUNTER — Other Ambulatory Visit (INDEPENDENT_AMBULATORY_CARE_PROVIDER_SITE_OTHER): Payer: BLUE CROSS/BLUE SHIELD

## 2016-10-01 DIAGNOSIS — I7121 Aneurysm of the ascending aorta, without rupture: Secondary | ICD-10-CM

## 2016-10-01 DIAGNOSIS — I712 Thoracic aortic aneurysm, without rupture: Secondary | ICD-10-CM | POA: Diagnosis not present

## 2016-10-01 LAB — BASIC METABOLIC PANEL
BUN: 14 mg/dL (ref 6–23)
CO2: 26 mEq/L (ref 19–32)
CREATININE: 0.82 mg/dL (ref 0.40–1.20)
Calcium: 9.7 mg/dL (ref 8.4–10.5)
Chloride: 104 mEq/L (ref 96–112)
GFR: 74.07 mL/min (ref >60.00)
Glucose, Bld: 113 mg/dL — ABNORMAL HIGH (ref 70–99)
POTASSIUM: 3.6 meq/L (ref 3.5–5.1)
Sodium: 141 mEq/L (ref 135–145)

## 2016-10-02 NOTE — Progress Notes (Signed)
Left detailed msg with results

## 2016-10-10 ENCOUNTER — Ambulatory Visit (INDEPENDENT_AMBULATORY_CARE_PROVIDER_SITE_OTHER)
Admission: RE | Admit: 2016-10-10 | Discharge: 2016-10-10 | Disposition: A | Discharge status: Home / Self Care | Payer: Medicare Other | Source: Ambulatory Visit | Attending: Internal Medicine | Admitting: Internal Medicine

## 2016-10-10 DIAGNOSIS — I712 Thoracic aortic aneurysm, without rupture: Secondary | ICD-10-CM

## 2016-10-10 DIAGNOSIS — I7121 Aneurysm of the ascending aorta, without rupture: Secondary | ICD-10-CM

## 2016-10-10 MED ORDER — IOPAMIDOL (ISOVUE-370) INJECTION 76%
100.0000 mL | Freq: Once | INTRAVENOUS | Status: AC | PRN
  Administered 2016-10-10: 100 mL via INTRAVENOUS

## 2016-10-10 NOTE — Progress Notes (Signed)
Spoke with pt and notified of results per Dr. Wert. Pt verbalized understanding and denied any questions. 

## 2016-10-30 ENCOUNTER — Encounter: Payer: Self-pay | Admitting: Internal Medicine

## 2016-10-30 ENCOUNTER — Ambulatory Visit (INDEPENDENT_AMBULATORY_CARE_PROVIDER_SITE_OTHER): Payer: Medicare Other | Admitting: Internal Medicine

## 2016-10-30 ENCOUNTER — Other Ambulatory Visit (INDEPENDENT_AMBULATORY_CARE_PROVIDER_SITE_OTHER): Payer: Medicare Other

## 2016-10-30 VITALS — BP 114/72 | HR 74 | Ht 69.0 in | Wt 185.4 lb

## 2016-10-30 DIAGNOSIS — I712 Thoracic aortic aneurysm, without rupture: Secondary | ICD-10-CM | POA: Diagnosis not present

## 2016-10-30 DIAGNOSIS — R058 Other specified cough: Secondary | ICD-10-CM

## 2016-10-30 DIAGNOSIS — J4530 Mild persistent asthma, uncomplicated: Secondary | ICD-10-CM | POA: Diagnosis not present

## 2016-10-30 DIAGNOSIS — R05 Cough: Secondary | ICD-10-CM | POA: Diagnosis not present

## 2016-10-30 DIAGNOSIS — I7121 Aneurysm of the ascending aorta, without rupture: Secondary | ICD-10-CM

## 2016-10-30 LAB — NITRIC OXIDE: NITRIC OXIDE: 32

## 2016-10-30 NOTE — Progress Notes (Signed)
Subjective:    Patient ID: [name], female    DOB: 07-30-1951    MRN: [id number]    Brief patient profile:  [age] yowf never regular smoker [place] where in 3rd grade  bad pna /prolonged admit and felt like recovered but since young adult tendency to head colds to end up in chest with worsening severity of cough > vomit with 2 episodes in 2016 the first in March 2016  and again in May 2016  referred by [name] for recurrent cough with evidence of airflow obst on office spirometry 05/12/15 and pfts done 07/12/2015 c/w very mild chronic airflow obst with  Non reversible component.    History of Present Illness  05/17/2015 1st [name]/ [name]   Chief Complaint  Patient presents with  . Pulmonary Consult    Referred by Joanne. Foy Howard. Pt c/o cough "for years" worse for the past 2 yrs with wheezing,    tends to cough/ clear throat sev times a day/ sometimes at hs but not typically first thing in am and usually nonproductive, hears herself wheeze When cough worse takes codeine and lots of cough drops / sometimes abx/ pred help/ assoc with intermittent HB/ takes prn ppi dulera tried/shaking and cough no better  rec Please remember to go to the x-ray department downstairs for your tests - we will call you with the results when they are available. Change prilosec 40 mg  Take  30-60 min before first meal of the day and Pepcid (famotidine)  20 mg one @  bedtime until return to office - this is the best way to tell whether stomach acid is contributing to your problem.   GERD  Diet  As needed ok to use xopenex hfa  Up to 2 pffs every 4 hours for breathing or coughing problems     07/12/2015 f/u ov/[name] re: mild persistent asthma  Chief Complaint  Patient presents with  . Follow-up    PFT done today. Pt states her cough has improved some. No new co's today.   was taking acid suppression prior to the last flare and had same severe head cold to chest symptoms  progression  Presently no symptoms and no need for xopenex at all  rec singulair 10 mg one daily typically taken in evening automatically  Please schedule a follow up visit in 6 months but call sooner if needed  If continues to flare needs sinus ct/ allergy profile and trial of qvar maint rx    09/28/2015  Acute extended  ov/[name] re:  Cough/sob  Despite  despite saba/qvar/ pred rx and new R pleuritic cp Chief Complaint  Patient presents with  . Acute Visit    Pt states developed "cold" that turned into bronchitis approx 2 wks ago- she was prescribed pred taper, qvar, and proair.  She c/o SOB, nausea, chest tightness, and insomnia.  acute onset oct 12th felt onset head cold from multiple fm members sick > chest cold > eval 09/23/15 > rx pred/qvar 80 one bid  /proair Stopped pred on 09/26/15 with onset R shoulder and R lat chest 09/26/15 / worse sob/cp  lying down  Cough better but still urge to clear throat, some green mucus ? pnds saba not really  helping sob rec Please see patient coordinator before you leave today  to schedule ct of sinus and chest asap> a/f level > augmentin x 10 days For pain or cough/ throat clearing > ultram 50 mg  1-2 every 4 hours as needed qvar 80 is one twice daily  Only use your albuterol as a rescue medication  -late add notified 08/25/2015 not filling  Singulair     10/21/2015  f/u ov/[name] re: asthma with prominent cough / ? uacs related to sinusitis  Chief Complaint  Patient presents with  . Follow-up    pt following for upper airway cough syndrome: pt states she is doing much better. pt states she still has a little dry cough but much better.  no c/o SOB, wheezing, or chest tightness. Pt has CT of sinus this morning.  Not limited by breathing from desired activities   rec Ok to leave off qvar and singulair but at onset of resp flare > first step is qvar 2 every 12 hours and if not improving after a week restart singulair  For drippy nose, itching /  sneezing > zyrtec   01/09/2016  f/u ov/[name] ZO:XWRUEre:cough variant asthma  better ? From qvar / has not tried zyrtec  Chief Complaint  Patient presents with  . Follow-up    Pt states developed a cold a few wks ago and had increased cough for 1 wk. She is feeling well today and is not coughing much.   rec Resume qvar 80 one twice daily and at onset of any respiratory flare increase 2 every 12 hours  For drippy nose, itching / sneezing/ tickle > zyrtec  Prevar 13 today       10/30/2016  f/u ov/[name] re: cough variant asthma/ Chief Complaint  Patient presents with  . Follow-up    asthma; pt states she has been doing well except when she gets a cold-most recent flare she used Qvar and previous instructions which seemed to help   last qvar Oct 2017 and planning trip to [place] with ? What to do if flare now that off the qvar s symptoms or need for saba     No obvious day to day or daytime variability or assoc excess/ purulent sputum or mucus plugs   subjective wheeze or overt sinus or hb symptoms on prilosec 40 mg qd ac. No unusual exp hx or h/o childhood pna/ asthma or knowledge of premature birth.  Sleeping ok without nocturnal  or early am exacerbation  of respiratory  c/o's or need for noct saba. Also denies any obvious fluctuation of symptoms with weather or environmental changes or other aggravating or alleviating factors except as outlined above   Current Medications, Allergies, Complete Past Medical History, Past Surgical History, Family History, and Social History were reviewed in [facility] electronic medical record.  ROS  The following are not active complaints unless bolded sore throat, dysphagia, dental problems, itching, sneezing,  nasal congestion or excess/ purulent secretions, ear ache,   fever, chills, sweats, unintended wt loss, classically pleuritic or exertional cp, hemoptysis,  orthopnea pnd or leg swelling, presyncope, palpitations, abdominal pain, anorexia, nausea,  vomiting, diarrhea  or change in bowel or bladder habits, change in stools or urine, dysuria,hematuria,  rash, arthralgias, visual complaints, headache, numbness, weakness or ataxia or problems with walking or coordination,  change in mood/affect or memory.               Objective:   Physical Exam  Pleasant but very anxious amb wf nad     10/21/2015    190  > 01/09/2016    190  > 10/30/2016   185     09/28/15 190 lb (86.183 kg)  07/12/15 190  lb (86.183 kg)  05/17/15 191 lb 6.4 oz (86.818 kg)    Vital signs reviewed     HEENT: nl dentition, turbinates, and orophanx. Nl external ear canals without cough reflex   NECK :  without JVD/Nodes/TM/ nl carotid upstrokes bilaterally   LUNGS: no acc muscle use, clear to A and P bilaterally without cough on insp or exp maneuvers   CV:  RRR  no s3 or murmur or increase in P2, no edema   ABD:  soft and nontender with nl excursion in the supine position. No bruits or organomegaly, bowel sounds nl  MS:  warm without deformities, calf tenderness, cyanosis or clubbing  SKIN: warm and dry without lesions    NEURO:  alert, approp, no deficits     CTa chest 10/10/2016   No change in TA/ no nodules   Labs ordered 10/30/2016   Allergy profile/eos    Assessment & Plan:

## 2016-10-30 NOTE — Patient Instructions (Addendum)
Start qvar one twice daily one week before your trip and at the first sign of any problem increase to 2 every 12 hours  Work on inhaler technique:  relax and gently blow all the way out then take a nice smooth deep breath back in, triggering the inhaler at same time you start breathing in.  Hold for up to 5 seconds if you can. Blow out thru nose. Rinse and gargle with water when done  Please see patient coordinator before you leave today  to schedule Cardiology consultation after the holidays  Please remember to go to the lab  department downstairs for your tests - we will call you with the results when they are available.     If you are satisfied with your treatment plan,  let your doctor know and he/she can either refill your medications or you can return here when your prescription runs out (but we can refill it for a year)    If in any way you are not 100% satisfied,  please tell us.  If 100% better, tell your friends!  Pulmonary follow up is as needed

## 2016-10-31 LAB — RESPIRATORY ALLERGY PROFILE REGION II ~~LOC~~
Allergen, C. Herbarum, M2: <0.1 kU/L
Allergen, Comm Silver Birch, t9: <0.1 kU/L
Allergen, Cottonwood, t14: <0.1 kU/L
Allergen, D pternoyssinus,d7: <0.1 kU/L
Allergen, Mulberry, t76: <0.1 kU/L
Bermuda Grass: <0.1 kU/L
CAT DANDER: 0.53 kU/L — AB
Cockroach: <0.1 kU/L
D. farinae: <0.1 kU/L
Dog Dander: 0.26 kU/L — ABNORMAL HIGH
Elm IgE: <0.1 kU/L
IgE (Immunoglobulin E), Serum: 30 kU/L (ref <115)

## 2016-10-31 LAB — CBC WITH DIFFERENTIAL/PLATELET
BASOS ABS: 0.1 10*3/uL (ref 0.0–0.1)
Basophils Relative: 0.6 % (ref 0.0–3.0)
EOS PCT: 2.1 % (ref 0.0–5.0)
Eosinophils Absolute: 0.2 10*3/uL (ref 0.0–0.7)
HEMATOCRIT: 41.2 % (ref 36.0–46.0)
HEMOGLOBIN: 13.8 g/dL (ref 12.0–15.0)
LYMPHS PCT: 35.8 % (ref 12.0–46.0)
Lymphs Abs: 4.1 10*3/uL — ABNORMAL HIGH (ref 0.7–4.0)
MCHC: 33.4 g/dL (ref 30.0–36.0)
MCV: 83.4 fl (ref 78.0–100.0)
MONOS PCT: 5.6 % (ref 3.0–12.0)
Monocytes Absolute: 0.7 10*3/uL (ref 0.1–1.0)
Neutro Abs: 6.5 10*3/uL (ref 1.4–7.7)
Neutrophils Relative %: 55.9 % (ref 43.0–77.0)
Platelets: 260 10*3/uL (ref 150.0–400.0)
RBC: 4.94 Mil/uL (ref 3.87–5.11)
RDW: 14.8 % (ref 11.5–15.5)
WBC: 11.6 10*3/uL — AB (ref 4.0–10.5)

## 2016-10-31 NOTE — Assessment & Plan Note (Signed)
See CT chest 09/28/2015 x 4.0 cm - rec bisoprolol 2.5 mg daily 09/28/2015 > never took it - 10/10/2016   No change in TA/ no nodules  > ref to cards 10/30/2016

## 2016-10-31 NOTE — Assessment & Plan Note (Signed)
-   trial rx for cyclical cough 05/18/2015 > resolved.  - trial of singulair 10 mg q pm 07/12/2015 >>> did not refill p one month - sinus ct 09/28/2015 > Multifocal paranasal sinus disease. Air-fluid level in the left maxillary antrum. Mucosal thickening is most pronounced in the right maxillary antrum as well as in multiple ethmoid air cells on the right. Note that the aerated right frontal is opacified. There is a retention cyst in the left sphenoid sinus region> rec Augmentin 875 mg take one pill twice daily  X20 days  > then repeat CT Sinus done 10/21/2015 with resolution of a/f levels   Resolved, no further w/u needed

## 2016-10-31 NOTE — Assessment & Plan Note (Addendum)
Spirometry 05/12/15 abn f/v loop  - PFT's while asymptomatic 07/12/2015  FEV1  2.36 (82%) ratio 67 and no better p saba and dlco 79% corrects to 85%  - Singulair trial 07/12/2015 > notified 08/25/2015 not filling  - FENO 10/30/2016  =  32 off all rx x one month  - 10/30/2016  After extensive coaching HFA effectiveness =    90%  > continue qvar 1-2 bid     I had an extended discussion with the patient reviewing all relevant studies completed to date and  lasting 15 to 20 minutes of a 25 minute visit on the following ongoing concerns:   FENO is suggestive of mild unaddressed inflammation and pt should air on the safe side in terms of suppression with ICS as they are so slow to work in event of flare.  Allergy profile sent today to see if can identify any underlying source of airways inflammation  Each maintenance medication was reviewed in detail including most importantly the difference between maintenance and as needed and under what circumstances the prns are to be used.  Please see AVS for list of customized instructions   which were reviewed in writing and the patient given a copy.

## 2016-11-01 ENCOUNTER — Telehealth: Payer: Self-pay | Admitting: Internal Medicine

## 2016-11-01 NOTE — Progress Notes (Signed)
LMTCB

## 2016-11-01 NOTE — Telephone Encounter (Signed)
I called and spoke with the pt and notified of results per MW  She verbalized understanding

## 2016-11-01 NOTE — Progress Notes (Signed)
Spoke with pt and notified of results per Dr. Wert. Pt verbalized understanding and denied any questions. 

## 2016-12-20 ENCOUNTER — Telehealth: Payer: Self-pay | Admitting: Pulmonary Disease

## 2016-12-20 MED ORDER — PREDNISONE 20 MG PO TABS: 20.0000 mg | ORAL_TABLET | Freq: Every day | ORAL | 0 refills | Status: DC

## 2016-12-20 NOTE — Telephone Encounter (Signed)
[name] called this evening to report significant wheezing. She is still coughing up a lot of mucus.  She has been taking tussionex in the evenings.  Rec extend course of prednisone 20mg  daily x5 more days in addition to what was Rx'd by her PCP.  Continue albuterol prn, add back QVar prior to finishing prednisone.   Discussed signs of pneumonia (fever, worsening dyspnea, chest pain) and advised her to seek care immediately if any of those develop or if she is worse.

## 2016-12-21 ENCOUNTER — Other Ambulatory Visit: Payer: Self-pay | Admitting: *Deleted

## 2016-12-21 MED ORDER — PREDNISONE 20 MG PO TABS: 20.0000 mg | ORAL_TABLET | Freq: Every day | ORAL | 0 refills | Status: DC

## 2016-12-21 NOTE — Addendum Note (Signed)
Addended by: Jaynee EaglesLEMONS, Gentri Guardado C on: 12/21/2016 11:10 AM   Modules accepted: Orders

## 2016-12-26 ENCOUNTER — Encounter: Payer: Self-pay | Admitting: Acute Care

## 2016-12-26 ENCOUNTER — Ambulatory Visit (INDEPENDENT_AMBULATORY_CARE_PROVIDER_SITE_OTHER): Payer: Medicare Other | Admitting: Acute Care

## 2016-12-26 ENCOUNTER — Ambulatory Visit (INDEPENDENT_AMBULATORY_CARE_PROVIDER_SITE_OTHER)
Admission: RE | Admit: 2016-12-26 | Discharge: 2016-12-26 | Disposition: A | Discharge status: Home / Self Care | Payer: Medicare Other | Source: Ambulatory Visit | Attending: Acute Care | Admitting: Acute Care

## 2016-12-26 VITALS — BP 152/80 | HR 74 | Ht 69.0 in | Wt 185.2 lb

## 2016-12-26 DIAGNOSIS — J45909 Unspecified asthma, uncomplicated: Secondary | ICD-10-CM

## 2016-12-26 DIAGNOSIS — R05 Cough: Secondary | ICD-10-CM | POA: Diagnosis not present

## 2016-12-26 DIAGNOSIS — J4530 Mild persistent asthma, uncomplicated: Secondary | ICD-10-CM | POA: Diagnosis not present

## 2016-12-26 DIAGNOSIS — R058 Other specified cough: Secondary | ICD-10-CM

## 2016-12-26 LAB — NITRIC OXIDE: NITRIC OXIDE: 16

## 2016-12-26 MED ORDER — AMOXICILLIN-POT CLAVULANATE 875-125 MG PO TABS
1.0000 | ORAL_TABLET | Freq: Two times a day (BID) | ORAL | 0 refills | Status: DC
Start: 2016-12-26 — End: 2017-01-16

## 2016-12-26 MED ORDER — TRAMADOL HCL 50 MG PO TABS: ORAL_TABLET | ORAL | 0 refills | Status: DC

## 2016-12-26 NOTE — Progress Notes (Signed)
History of Present Illness [name] is a 66 y.o. female never regular smoker  with recurrent cough and mild persistant chronic asthma. She is followed by Dr. Sherene SiresWert.   12/26/2016 Acute OV  Patient presents to the office today for complaint of asthma flare after Flu diagnosis on 12/15/2016 by Dr. Izola PriceMyers at [facility].. Patient was treated at that time with Tamiflu, and a prednisone taper. She called the office for an additional 5 days of prednisone on 12/20/2016. She has completed both, last day of prednisone was today 12/26/2016.Chest x-ray was not done at the time. She continues to complain of shortness of breath, and nonproductive thin secretions. Cough was productive for greenish secretions with the flu . She states her cough is now productive for clear to white secretions. She states she has a cough with wheezing. She is concerned that this is a flare of her asthma post flu. She has not been taking her maintenance Qvar. She states that she is using her rescue inhaler multiple times a day. She states she is compliant with her Prilosec and Pepcid at night. She states she has been taking her Tussionex for cough each night for the last week. Additionally she is using chlor tabs which she states seem to be helping her especially at night. She does note continued green nasal secretions. She denies fever since the flu resolved, chest pain, orthopnea, or hemoptysis. She is anxious in the office today.  12/26/2016>> FENO>> 16 1/242018>> CXR>>Personally reviewed by me. IMPRESSION: There is no pneumonia, CHF, nor other acute cardiopulmonary abnormality   Tests Spirometry 05/12/15 abn f/v loop  - PFT's while asymptomatic 07/12/2015  FEV1  2.36 (82%) ratio 67 and no better p saba and dlco 79% corrects to 85%  - Singulair trial 07/12/2015 > notified 08/25/2015 not filling  - FENO 10/30/2016  =  32 off all rx x one month  - 10/30/2016  After extensive coaching HFA effectiveness =    90%  > continue qvar 1-2 bid    Past medical hx Past Medical History:  Diagnosis Date  . GERD (gastroesophageal reflux disease)   . Hypertension      Past surgical hx, Family hx, Social hx all reviewed.  Current Outpatient Prescriptions on File Prior to Visit  Medication Sig  . aspirin 81 MG tablet Take 81 mg by mouth daily.  Marland Kitchen. b complex vitamins tablet Take 1 tablet by mouth daily.  . beclomethasone (QVAR) 80 MCG/ACT inhaler One twice daily  . chlorpheniramine-HYDROcodone (TUSSIONEX PENNKINETIC ER) 10-8 MG/5ML SUER Take 5 mLs by mouth every 12 (twelve) hours as needed for cough.  . cholecalciferol (VITAMIN D) 1000 UNITS tablet Take 1,000 Units by mouth daily.  Marland Kitchen. FIBER DIET TABS Take 1 tablet by mouth daily.  Marland Kitchen. glucosamine-chondroitin 500-400 MG tablet Take 1 tablet by mouth 3 (three) times daily.  . hydrochlorothiazide (HYDRODIURIL) 25 MG tablet Take 12.5 mg by mouth daily.   Marland Kitchen. LECITHIN PO Take 1 tablet by mouth daily.  . Multiple Vitamin (MULTIVITAMIN) capsule Take 1 capsule by mouth daily.  Marland Kitchen. omeprazole (PRILOSEC) 20 MG capsule Take 20 mg by mouth daily.  Marland Kitchen. PROAIR HFA 108 (90 BASE) MCG/ACT inhaler Inhale 2 puffs into the lungs every 6 (six) hours as needed.  . RESTASIS 0.05 % ophthalmic emulsion Place 1 drop into both eyes 2 (two) times daily.   . vitamin C (ASCORBIC ACID) 250 MG tablet Take 250 mg by mouth daily.   No current facility-administered medications on file prior  to visit.      Allergies  Allergen Reactions  . Dulera [Mometasone Furo-Formoterol Fum] Palpitations    Review Of Systems:  Constitutional:   No  weight loss, night sweats,  Fevers, chills, fatigue, or  lassitude.  HEENT:   No headaches,  Difficulty swallowing,  Tooth/dental problems, or  Sore throat,                No sneezing, itching, ear ache, +nasal congestion, +post nasal drip,   CV:  No chest pain,  Orthopnea, PND, swelling in lower extremities, anasarca, dizziness, palpitations, syncope.   GI  No heartburn, indigestion,  abdominal pain, nausea, vomiting, diarrhea, change in bowel habits, loss of appetite, bloody stools.   Resp: + shortness of breath with exertion less at rest.  + excess mucus, + productive cough,  No non-productive cough,  No coughing up of blood.  No change in color of mucus.  + wheezing.  No chest wall deformity  Skin: no rash or lesions.  GU: no dysuria, change in color of urine, no urgency or frequency.  No flank pain, no hematuria   MS:  No joint pain or swelling.  No decreased range of motion.  No back pain.  Psych:  No change in mood or affect. No depression or anxiety.  No memory loss.   Vital Signs BP (!) 152/80 (BP Location: Left Arm, Cuff Size: Normal)   Pulse 74   Ht 5\' 9"  (1.753 m)   Wt 185 lb 3.2 oz (84 kg)   SpO2 96%   BMI 27.35 kg/m    Physical Exam:  General- No distress,  A&Ox3, pleasant but anxious ENT: No sinus tenderness, TM clear, edematous nasal mucosa, no oral exudate,+ post nasal drip, no LAN Cardiac: S1, S2, regular rate and rhythm, no murmur Chest:+ wheeze/ No rales/ dullness; no accessory muscle use, no nasal flaring, no sternal retractions Abd.: Soft Non-tender Ext: No clubbing cyanosis, edema Neuro:  normal strength Skin: No rashes, warm and dry Psych: normal mood and behavior, but anxious   Assessment/Plan  Mild persistent chronic asthma without complication Moderate Asthma Flare with sinusitis  post Flu Treated with pred taper x 2 since 12/15/2016. CXR without  pneumonia, CHF, acute cardiopulmonary abnormality. No maintenance medication ( QVAR) Green nasal secretions FENO 16 in the office today Plan: CXR today   FENO today QVAR one puff twice daily Escalate to 2 puffs twice dily if no improvement per Dr. Thurston Hole previous instructions. Rinse mouth after use For pain or cough/ throat clearing > ultram 50 mg 1-2 at hour of sleep Do not drive if sleepy. Delsym during the day for cough. Sugar free jolly ranchers for throat soothing Sips  of water instead of throat clearing Avoid mint, menthol, chocolate Mucinex 1200 mg daily with chest congestion. Augmentin 875 mg I tablet twice daily x 10 days. Continue taking your Chlor tab as you have been doing. Follow up appointment in 3-4 weeks with Enna Warwick or Dr. Sherene Sires. Please contact office for sooner follow up if symptoms do not improve or worsen or seek emergency care   I  Upper airway cough syndrome Flare with recent Flu 12/15/2016 Compliant with GERD regimen Aggravated by suspected sinusitis Plan: FENO today QVAR one puff twice daily with flare Rinse mouth after use For pain or cough/ throat clearing > ultram 50 mg 1-2 at hour of sleep Do not drive if sleepy. Delsym during the day for cough. Sugar free jolly ranchers for throat soothing Sips of water  instead of throat clearing Avoid mint, menthol, chocolate Augmentin 875 mg I tablet twice daily x 10 days. Continue taking your Chlor tab as you have been doing. Follow up appointment in 3-4 weeks with Kadince Boxley or Dr. Sherene Sires. Please contact office for sooner follow up if symptoms do not improve or worsen or seek emergency care      Bevelyn Ngo, NP 12/26/2016  7:21 PM

## 2016-12-26 NOTE — Assessment & Plan Note (Addendum)
Moderate Asthma Flare with sinusitis  post Flu Treated with pred taper x 2 since 12/15/2016. CXR without  pneumonia, CHF, acute cardiopulmonary abnormality. No maintenance medication ( QVAR) Green nasal secretions FENO 16 in the office today Plan: CXR today   FENO today QVAR one puff twice daily Escalate to 2 puffs twice dily if no improvement per Dr. Thurston HoleWert's previous instructions. Rinse mouth after use For pain or cough/ throat clearing > ultram 50 mg 1-2 at hour of sleep Do not drive if sleepy. Delsym during the day for cough. Sugar free jolly ranchers for throat soothing Sips of water instead of throat clearing Avoid mint, menthol, chocolate Mucinex 1200 mg daily with chest congestion. Augmentin 875 mg I tablet twice daily x 10 days. Continue taking your Chlor tab as you have been doing. Follow up appointment in 3-4 weeks with Sarah or Dr. Sherene SiresWert. Please contact office for sooner follow up if symptoms do not improve or worsen or seek emergency care   I

## 2016-12-26 NOTE — Patient Instructions (Addendum)
CXR today stat  FENO today QVAR one puff twice daily with flare Rinse mouth after use For pain or cough/ throat clearing > ultram 50 mg 1-2 at hour of sleep Do not drive if sleepy. Delsym during the day for cough. Sugar free jolly ranchers for throat soothing Sips of water instead of throat clearing Avoid mint, menthol, chocolate Mucinex 1200 mg daily with chest congestion. Augmentin 875 mg I tablet twice daily x 10 days. Continue taking your Chlor tab as you have been doing. Follow up appointment in 3-4 weeks with [name] or [name]. Please contact office for sooner follow up if symptoms do not improve or worsen or seek emergency care

## 2016-12-26 NOTE — Assessment & Plan Note (Signed)
Flare with recent Flu 12/15/2016 Compliant with GERD regimen Aggravated by suspected sinusitis Plan: FENO today QVAR one puff twice daily with flare Rinse mouth after use For pain or cough/ throat clearing > ultram 50 mg 1-2 at hour of sleep Do not drive if sleepy. Delsym during the day for cough. Sugar free jolly ranchers for throat soothing Sips of water instead of throat clearing Avoid mint, menthol, chocolate Augmentin 875 mg I tablet twice daily x 10 days. Continue taking your Chlor tab as you have been doing. Follow up appointment in 3-4 weeks with Cherell Colvin or Dr. Sherene SiresWert. Please contact office for sooner follow up if symptoms do not improve or worsen or seek emergency care

## 2016-12-27 NOTE — Progress Notes (Signed)
Chart and office note reviewed in detail  > agree with a/p as outlined    

## 2016-12-28 ENCOUNTER — Telehealth: Payer: Self-pay | Admitting: Internal Medicine

## 2016-12-28 ENCOUNTER — Other Ambulatory Visit: Payer: Self-pay | Admitting: Obstetrics and Gynecology

## 2016-12-28 NOTE — Telephone Encounter (Signed)
Notes Recorded by Bevelyn NgoSarah F Groce, NP on 12/26/2016 at 5:23 PM EST Please call patient and let her know her CXR was negative for pneumonia. Have her follow the plan of care developed in the office . Thanks so much.       Spoke with pt and advised her of her test results. Pt verbalized understanding and nothing further is needed.

## 2017-01-01 ENCOUNTER — Ambulatory Visit: Payer: Medicare Other | Admitting: Interventional Cardiology

## 2017-01-01 LAB — CYTOLOGY - PAP

## 2017-01-07 ENCOUNTER — Encounter: Payer: Self-pay | Admitting: Cardiology

## 2017-01-07 ENCOUNTER — Ambulatory Visit (INDEPENDENT_AMBULATORY_CARE_PROVIDER_SITE_OTHER): Payer: Medicare Other | Admitting: Cardiology

## 2017-01-07 VITALS — BP 112/76 | HR 74 | Ht 69.0 in | Wt 182.0 lb

## 2017-01-07 DIAGNOSIS — Z01818 Encounter for other preprocedural examination: Secondary | ICD-10-CM | POA: Diagnosis not present

## 2017-01-07 DIAGNOSIS — I77810 Thoracic aortic ectasia: Secondary | ICD-10-CM | POA: Insufficient documentation

## 2017-01-07 NOTE — Progress Notes (Signed)
PCP: Joycelyn Rua, MD  PULMONOLOGY: Dr. Sherene Sires   Clinic Note: Chief Complaint  Patient presents with  . New Patient (Initial Visit)    thoracic ascending aorta aneurysm    HPI: [name] is a 66 y.o. female with a PMH below who presents today for cardiology evaluation or ~ dilated thoracic Aorta on CT of chest.. She has a history of asthma and had recent flu in January 2018 , by an asthma exacerbation.  Recent Hospitalizations: n/a  Studies Reviewed:   CT angiogram of the chest November 2017: Ascending thoracic aorta mildly prominent measuring 3.9 x 3.9 cm. (recommend annual f/u) Stable without growth. No significant atheroma. Minimal plaque in the LAD  Interval History: [name] is essentially stable from a cardiac standpoint with the exception Of noticing some occasional palpitations. She has not had any symptoms whatsoever of chest tightness or pressure with rest or exertion. She may get a little bit dyspneic when her asthma is acting up, but not otherwise. She has not noted any rapid heart rhythms or arrhythmias. No syncope or near syncope. No PND, orthopnea or edema.   There is no family history that she can note of connective tissue disorder or aortic aneurysm. She has not had any history of valvular disease.  She denies any TIA or amaurosis fugax symptoms.  ROS: A comprehensive was performed. Review of Systems  Constitutional: Negative for malaise/fatigue.  HENT: Negative for nosebleeds.   Respiratory: Positive for cough and wheezing. Negative for sputum production.        Currently has symptom of viral illness.  Cardiovascular: Negative for claudication and leg swelling.       Per history of present illness  Gastrointestinal: Positive for blood in stool and melena.  Genitourinary: Positive for hematuria.  Skin: Negative.   Neurological: Negative.   Psychiatric/Behavioral: Negative.      Past Medical History:  Diagnosis Date  . GERD (gastroesophageal reflux  disease)   . Hypertension    History of childhood pneumonia in third grade  Past Surgical History:  Procedure Laterality Date  . APPENDECTOMY  2012  . bladder disection    . CESAREAN SECTION     x 2  . COLON RESECTION  1982   1982  . HERNIA REPAIR      Current Meds  Medication Sig  . amoxicillin-clavulanate (AUGMENTIN) 875-125 MG tablet Take 1 tablet by mouth 2 (two) times daily.  Marland Kitchen aspirin 81 MG tablet Take 81 mg by mouth daily.  Marland Kitchen b complex vitamins tablet Take 1 tablet by mouth daily.  . beclomethasone (QVAR) 80 MCG/ACT inhaler One twice daily  . chlorpheniramine-HYDROcodone (TUSSIONEX PENNKINETIC ER) 10-8 MG/5ML SUER Take 5 mLs by mouth every 12 (twelve) hours as needed for cough.  . cholecalciferol (VITAMIN D) 1000 UNITS tablet Take 1,000 Units by mouth daily.  Marland Kitchen FIBER DIET TABS Take 1 tablet by mouth daily.  Marland Kitchen glucosamine-chondroitin 500-400 MG tablet Take 1 tablet by mouth 3 (three) times daily.  . hydrochlorothiazide (HYDRODIURIL) 25 MG tablet Take 12.5 mg by mouth daily.   Marland Kitchen LECITHIN PO Take 1 tablet by mouth daily.  . Multiple Vitamin (MULTIVITAMIN) capsule Take 1 capsule by mouth daily.  Marland Kitchen omeprazole (PRILOSEC) 20 MG capsule Take 20 mg by mouth daily.  Marland Kitchen PREMARIN vaginal cream Use as directed  . PROAIR HFA 108 (90 BASE) MCG/ACT inhaler Inhale 2 puffs into the lungs every 6 (six) hours as needed.  . RESTASIS 0.05 % ophthalmic emulsion Place 1 drop  into both eyes 2 (two) times daily.   . traMADol (ULTRAM) 50 MG tablet 1-2 every 4 hours as needed for cough or pain  . vitamin C (ASCORBIC ACID) 250 MG tablet Take 250 mg by mouth daily.    Allergies  Allergen Reactions  . Dulera [Mometasone Furo-Formoterol Fum] Palpitations    Social History   Social History  . Marital status: Married    Spouse name: N/A  . Number of children: N/A  . Years of education: N/A   Occupational History  . Homemaker    Social History Main Topics  . Smoking status: Former Smoker     Packs/day: 1.00    Years: 5.00    Types: Cigarettes    Quit date: 12/03/1973  . Smokeless tobacco: Never Used  . Alcohol use 0.0 oz/week  . Drug use: No  . Sexual activity: Not Asked   Other Topics Concern  . None   Social History Narrative  . None    family history includes Asthma in her brother, brother, and paternal grandfather; Clotting disorder in her mother.  Wt Readings from Last 3 Encounters:  01/07/17 82.6 kg (182 lb)  12/26/16 84 kg (185 lb 3.2 oz)  10/30/16 84.1 kg (185 lb 6.4 oz)    PHYSICAL EXAM BP 112/76 (BP Location: Right Arm, Patient Position: Sitting, Cuff Size: Normal)   Pulse 74   Ht 5\' 9"  (1.753 m)   Wt 82.6 kg (182 lb)   BMI 26.88 kg/m  General appearance: alert, cooperative, appears stated age, no distress and Well-nourished, well-groomed Neck: no adenopathy, no carotid bruit and no JVD Lungs: clear to auscultation bilaterally, normal percussion bilaterally and non-labored Heart: regular rate and rhythm, S1 and S2 normal, no murmur, click, rub or gallop; nondisplaced PMI Abdomen: soft, non-tender; bowel sounds normal; no masses,  no organomegaly; no HJR Extremities: extremities normal, atraumatic, no cyanosis, or edema  Pulses: 2+ and symmetric; Skin: mobility and turgor normal, no evidence of bleeding or bruising and no lesions noted  Neurologic: Mental status: Alert, oriented, thought content appropriate Cranial nerves: normal (II-XII grossly intact)    Adult ECG Report  Rate: 74 ;  Rhythm: normal sinus rhythm and indeterminate; normal axis, intervals and durations  Narrative Interpretation: normal EKG   Other studies Reviewed: Additional studies/ records that were reviewed today include:  Recent Labs:  n/a   ASSESSMENT / PLAN: Problem List Items Addressed This Visit    Thoracic aortic ectasia (HCC) - Primary (Chronic)    2 consecutive CTA of the chest done as part of her evaluation of her lung disease shown a roughly 4 x 4 thoracic  aortic dilation. Has essentially been stable. No physical exam findings to suggest aortic stenosis.  At this point, I would simply follow up CT imaging of the chest this year in November I was part of annual follow-up.  As thoracic aortic disease is atherosclerotic in nature, we will go ahead and check a screening lipid panel and metabolic panel. She will also need BMP prior to having her CT angiogram in November.      Relevant Orders   EKG 12-Lead (Completed)   CT ANGIO CHEST AORTA W &/OR WO CONTRAST   Lipid panel   Comprehensive metabolic panel   Basic metabolic panel    Other Visit Diagnoses    Pre-op testing       Relevant Orders   Lipid panel   Comprehensive metabolic panel   Basic metabolic panel  Current medicines are reviewed at length with the patient today. (+/- concerns) n/a The following changes have been made: n/a  Patient Instructions  SCHEDULE  NOV 2018 You will need lab work prior to ct scan , will mail you the lab slip ath that time Non-Cardiac CT Angiography (CTA ) look at THORACIC AORTA , is a special type of CT scan that uses a computer to produce multi-dimensional views of major blood vessels throughout the body. In CT angiography, a contrast material is injected through an IV to help visualize the blood vessels    Labs - do not eat or drink the morning of the lab test Please have LABs done at  [facility] - first floor [facility]     Your physician wants you to follow-up in NOV 2018 WITH DR HARDING- AFTER CTA IS COMPETED. You will receive a reminder letter in the mail two months in advance. If you don't receive a letter, please call our office to schedule the follow-up appointment.   If you need a refill on your cardiac medications before your next appointment, please call your pharmacy.    Studies Ordered:   Orders Placed This Encounter  Procedures  . CT ANGIO CHEST AORTA W &/OR WO CONTRAST  . Lipid panel  . Comprehensive  metabolic panel  . Basic metabolic panel  . EKG 12-Lead      Bryan Lemmaavid Harding, M.D., M.S. Interventional Cardiologist   Pager # [phone] Phone # [phone] [address] [place], [place] [address]

## 2017-01-07 NOTE — Patient Instructions (Addendum)
SCHEDULE  NOV 2018 You will need lab work prior to ct scan , will mail you the lab slip ath that time Non-Cardiac CT Angiography (CTA ) look at THORACIC AORTA , is a special type of CT scan that uses a computer to produce multi-dimensional views of major blood vessels throughout the body. In CT angiography, a contrast material is injected through an IV to help visualize the blood vessels    Labs - do not eat or drink the morning of the lab test Please have LABs done at  [facility] [facility]     Your physician wants you to follow-up in NOV 2018 WITH DR HARDING- AFTER CTA IS COMPETED. You will receive a reminder letter in the mail two months in advance. If you don't receive a letter, please call our office to schedule the follow-up appointment.   If you need a refill on your cardiac medications before your next appointment, please call your pharmacy.

## 2017-01-09 ENCOUNTER — Encounter: Payer: Self-pay | Admitting: Cardiology

## 2017-01-09 NOTE — Assessment & Plan Note (Signed)
2 consecutive CTA of the chest done as part of her evaluation of her lung disease shown a roughly 4 x 4 thoracic aortic dilation. Has essentially been stable. No physical exam findings to suggest aortic stenosis.  At this point, I would simply follow up CT imaging of the chest this year in November I was part of annual follow-up.  As thoracic aortic disease is atherosclerotic in nature, we will go ahead and check a screening lipid panel and metabolic panel. She will also need BMP prior to having her CT angiogram in November.

## 2017-01-16 ENCOUNTER — Encounter: Payer: Self-pay | Admitting: Internal Medicine

## 2017-01-16 ENCOUNTER — Ambulatory Visit (INDEPENDENT_AMBULATORY_CARE_PROVIDER_SITE_OTHER): Payer: Medicare Other | Admitting: Internal Medicine

## 2017-01-16 VITALS — BP 128/78 | HR 84 | Ht 69.0 in | Wt 179.0 lb

## 2017-01-16 DIAGNOSIS — J4530 Mild persistent asthma, uncomplicated: Secondary | ICD-10-CM | POA: Diagnosis not present

## 2017-01-16 NOTE — Progress Notes (Signed)
Subjective:    Patient ID: [name], female    DOB: Mar 29, 1951    MRN: [id number]    Brief patient profile:  27 yowf never regular smoker lived in [place] where in 3rd grade  bad pna /prolonged admit and felt like recovered but since young adult tendency to head colds to end up in chest with worsening severity of cough > vomit with 2 episodes in 2016 the first in March 2016  and again in May 2016  referred by Dr Foy Guadalajara for recurrent cough with evidence of airflow obst on office spirometry 05/12/15 and pfts done 07/12/2015 c/w very mild chronic airflow obst with  Non reversible component.    History of Present Illness  05/17/2015 1st [facility] office visit/ [name]   Chief Complaint  Patient presents with  . Pulmonary Consult    Referred by Dr. Foy Guadalajara. Pt c/o cough "for years" worse for the past 2 yrs with wheezing,    tends to cough/ clear throat sev times a day/ sometimes at hs but not typically first thing in am and usually nonproductive, hears herself wheeze When cough worse takes codeine and lots of cough drops / sometimes abx/ pred help/ assoc with intermittent HB/ takes prn ppi dulera tried/shaking and cough no better  rec Please remember to go to the x-ray department downstairs for your tests - we will call you with the results when they are available. Change prilosec 40 mg  Take  30-60 min before first meal of the day and Pepcid (famotidine)  20 mg one @  bedtime until return to office - this is the best way to tell whether stomach acid is contributing to your problem.   GERD  Diet  As needed ok to use xopenex hfa  Up to 2 pffs every 4 hours for breathing or coughing problems     07/12/2015 f/u ov/[name] re: mild persistent asthma  Chief Complaint  Patient presents with  . Follow-up    PFT done today. Pt states her cough has improved some. No new co's today.   was taking acid suppression prior to the last flare and had same severe head cold to chest symptoms  progression  Presently no symptoms and no need for xopenex at all  rec singulair 10 mg one daily typically taken in evening automatically  Please schedule a follow up visit in 6 months but call sooner if needed  If continues to flare needs sinus ct/ allergy profile and trial of qvar maint rx    09/28/2015  Acute extended  ov/[name] re:  Cough/sob  Despite  despite saba/qvar/ pred rx and new R pleuritic cp Chief Complaint  Patient presents with  . Acute Visit    Pt states developed "cold" that turned into bronchitis approx 2 wks ago- she was prescribed pred taper, qvar, and proair.  She c/o SOB, nausea, chest tightness, and insomnia.  acute onset oct 12th felt onset head cold from multiple fm members sick > chest cold > eval 09/23/15 > rx pred/qvar 80 one bid  /proair Stopped pred on 09/26/15 with onset R shoulder and R lat chest 09/26/15 / worse sob/cp  lying down  Cough better but still urge to clear throat, some green mucus ? pnds saba not really  helping sob rec Please see patient coordinator before you leave today  to schedule ct of sinus and chest asap> a/f level > augmentin x 10 days For pain or cough/ throat clearing > ultram 50 mg  1-2 every 4 hours as needed qvar 80 is one twice daily  Only use your albuterol as a rescue medication  -late add notified 08/25/2015 not filling  Singulair     10/21/2015  f/u ov/[name] re: asthma with prominent cough / ? uacs related to sinusitis  Chief Complaint  Patient presents with  . Follow-up    pt following for upper airway cough syndrome: pt states she is doing much better. pt states she still has a little dry cough but much better.  no c/o SOB, wheezing, or chest tightness. Pt has CT of sinus this morning.  Not limited by breathing from desired activities   rec Ok to leave off qvar and singulair but at onset of resp flare > first step is qvar 2 every 12 hours and if not improving after a week restart singulair  For drippy nose, itching /  sneezing > zyrtec      10/30/2016  f/u ov/[name] re: cough variant asthma/ Chief Complaint  Patient presents with  . Follow-up    asthma; pt states she has been doing well except when she gets a cold-most recent flare she used Qvar and previous instructions which seemed to help   last qvar Oct 2017 and planning trip to [place] with ? What to do if flare now that off the qvar s symptoms or need for saba  rec Start qvar one twice daily one week before your trip and at the first sign of any problem increase to 2 every 12 hours Work on inhaler technique:        01/16/2017  f/u ov/[name] re: cough variant asthma  Chief Complaint  Patient presents with  . Follow-up    feeling much better but has some residual cough  arrived back from [facility] 1/11/ 18 with flu Pos 2 d later rx tamiflu and started back on qvar 80 one bid  Then caught cold from grand daughter 2 weeks prior to OV  And rx augmentin  > 95+%   Better, some cough/ congestion esp in am with nasal congestion  And no need for albuterol  No obvious day to day or daytime variability or now with assoc   purulent sputum or mucus plugs   subjective wheeze or overt   hb symptoms on prilosec 20 mg qd ac but no pepcid at hs    No unusual exp hx or h/o childhood pna/ asthma or knowledge of premature birth.  Sleeping ok without nocturnal   exacerbation  of respiratory  c/o's or need for noct saba. Also denies any obvious fluctuation of symptoms with weather or environmental changes or other aggravating or alleviating factors except as outlined above   Current Medications, Allergies, Complete Past Medical History, Past Surgical History, Family History, and Social History were reviewed in [employer] record.  ROS  The following are not active complaints unless bolded sore throat, dysphagia, dental problems, itching, sneezing,  nasal congestion or excess/ purulent secretions, ear ache,   fever, chills, sweats, unintended wt  loss, classically pleuritic or exertional cp, hemoptysis,  orthopnea pnd or leg swelling, presyncope, palpitations, abdominal pain, anorexia, nausea, vomiting, diarrhea  or change in bowel or bladder habits, change in stools or urine, dysuria,hematuria,  rash, arthralgias, visual complaints, headache, numbness, weakness or ataxia or problems with walking or coordination,  change in mood/affect or memory.               Objective:   Physical Exam  Pleasant but very  anxious amb wf nad     10/21/2015    190  > 01/09/2016    190  > 10/30/2016   185 > 01/16/2017    179     09/28/15 190 lb (86.183 kg)  07/12/15 190 lb (86.183 kg)  05/17/15 191 lb 6.4 oz (86.818 kg)    Vital signs reviewed  - Note on arrival 02 sats  97% on RA       HEENT: nl dentition, turbinates, and orophanx. Nl external ear canals without cough reflex   NECK :  without JVD/Nodes/TM/ nl carotid upstrokes bilaterally   LUNGS: no acc muscle use, clear to A and P bilaterally without cough on insp or exp maneuvers   CV:  RRR  no s3 or murmur or increase in P2, no edema   ABD:  soft and nontender with nl excursion in the supine position. No bruits or organomegaly, bowel sounds nl  MS:  warm without deformities, calf tenderness, cyanosis or clubbing  SKIN: warm and dry without lesions    NEURO:  alert, approp, no deficits      I personally reviewed images and agree with radiology impression as follows:  CXR:   12/26/16 There is no pneumonia, CHF, nor other acute cardiopulmonary abnormality.    Assessment & Plan:

## 2017-01-16 NOTE — Assessment & Plan Note (Signed)
Spirometry 05/12/15 abn f/v loop  - PFT's while asymptomatic 07/12/2015  FEV1  2.36 (82%) ratio 67 and no better p saba and dlco 79% corrects to 85%  - Singulair trial 07/12/2015 > notified 08/25/2015 not filling  - FENO 10/30/2016  =   32 off all rx x one month  - 10/30/2016  After extensive coaching HFA effectiveness =    90%  > continue qvar 1-2 bid - Allergy profile 10/30/2016 >  Eos 0.2 /  IgE  30 RAST Pos Cat > dog - FENO   12/25/16  = 16 on qvar 80 one bid    Self managed appropriately and just about over her flare with min residual cough and no sob or need for saba so probably ok to taper completely off qvar too - needs to remember max gerd rx with flares based on :  Of the three most common causes of chronic cough, only one (GERD)  can actually cause the other two (asthma and post nasal drip syndrome)  and perpetuate the cylce of cough inducing airway trauma, inflammation, heightened sensitivity to reflux which is prompted by the cough itself via a cyclical mechanism.    This may partially respond to steroids and look like asthma and post nasal drainage but never erradicated completely unless the cough and the secondary reflux are eliminated, preferably both at the same time.  While not intuitively obvious, many patients with chronic low grade reflux do not cough until there is a secondary insult that disturbs the protective epithelial barrier and exposes sensitive nerve endings.  This can be viral as likely the case here  or direct physical injury such as with an endotracheal tube.   The point is that once this occurs, it is difficult to eliminate using anything but a maximally effective acid suppression regimen at least in the short run, accompanied by an appropriate diet to address non acid GERD.   I had an extended discussion with the patient reviewing all relevant studies completed to date and  lasting 15 to 20 minutes of a 25 minute visit    Each maintenance medication was reviewed in detail  including most importantly the difference between maintenance and prns and under what circumstances the prns are to be triggered using an action plan format that is not reflected in the computer generated alphabetically organized AVS.    Please see AVS for specific instructions unique to this visit that I personally wrote and verbalized to the the pt in detail and then reviewed with pt  by my nurse highlighting any  changes in therapy recommended at today's visit to their plan of care.

## 2017-01-16 NOTE — Patient Instructions (Addendum)
Stay on qvar until 100% better for at least 2 weeks with no supplements   At the onset of any respiratory flare > restart qvar 80 Take 2 puffs first thing in am and then another 2 puffs about 12 hours later.   For cough >  mucinex dm 1200 mg every 12 hours and add pepcid 20 mg at time    If you are satisfied with your treatment plan,  let your doctor know and he/she can either refill your medications or you can return here when your prescription runs out.     If in any way you are not 100% satisfied,  please tell us.  If 100% better, tell your friends!  Pulmonary follow up is as needed

## 2017-07-29 ENCOUNTER — Telehealth: Payer: Self-pay | Admitting: Internal Medicine

## 2017-07-29 MED ORDER — PROAIR HFA 108 (90 BASE) MCG/ACT IN AERS: 2.0000 | INHALATION_SPRAY | Freq: Four times a day (QID) | RESPIRATORY_TRACT | 5 refills | Status: DC | PRN

## 2017-07-29 NOTE — Telephone Encounter (Signed)
No pharmacy listed  LMTCB

## 2017-07-29 NOTE — Telephone Encounter (Signed)
Spoke with pt. She is needing a refill on Albuterol HFA. Rx has been sent in. Nothing further was needed. 

## 2017-07-29 NOTE — Telephone Encounter (Signed)
Patient called back checking on albuterol rx - She can be reached at [phone] -pr

## 2017-07-30 NOTE — Telephone Encounter (Signed)
lmomtcb x 2 will need the correct pharmacy to send in rx to .

## 2017-07-30 NOTE — Telephone Encounter (Signed)
Since patient has picked up this medication, will call this medication.

## 2017-07-30 NOTE — Telephone Encounter (Signed)
Pt states she picked up the medication after 5pm, the pharmacy computer had crash, the pharmacy on file for [facility] is correct...ert

## 2017-10-04 ENCOUNTER — Telehealth: Payer: Self-pay | Admitting: Internal Medicine

## 2017-10-04 NOTE — Telephone Encounter (Signed)
Left message for patient to call back  

## 2017-10-07 NOTE — Telephone Encounter (Signed)
MW please advise if you want to set up the ct prior to the appt with you or do you want to see the pt first?  Thanks   Stay on qvar until 100% better for at least 2 weeks with no supplements   At the onset of any respiratory flare > restart qvar 80 Take 2 puffs first thing in am and then another 2 puffs about 12 hours later.   For cough >  mucinex dm 1200 mg every 12 hours and add pepcid 20 mg at time    If you are satisfied with your treatment plan,  let your doctor know and he/she can either refill your medications or you can return here when your prescription runs out.     If in any way you are not 100% satisfied,  please tell us.  If 100% better, tell your friends!  Pulmonary follow up is as needed

## 2017-10-07 NOTE — Telephone Encounter (Signed)
See first

## 2017-10-07 NOTE — Telephone Encounter (Signed)
Patient has been scheduled with MW for 10/11/17 at 345.   Nothing else needed at time of call.

## 2017-10-11 ENCOUNTER — Encounter: Payer: Self-pay | Admitting: Internal Medicine

## 2017-10-11 ENCOUNTER — Ambulatory Visit: Payer: Medicare Other | Admitting: Internal Medicine

## 2017-10-11 VITALS — BP 114/78 | HR 80 | Ht 69.0 in | Wt 189.2 lb

## 2017-10-11 DIAGNOSIS — R058 Other specified cough: Secondary | ICD-10-CM

## 2017-10-11 DIAGNOSIS — J4530 Mild persistent asthma, uncomplicated: Secondary | ICD-10-CM

## 2017-10-11 DIAGNOSIS — R05 Cough: Secondary | ICD-10-CM | POA: Diagnosis not present

## 2017-10-11 DIAGNOSIS — I77810 Thoracic aortic ectasia: Secondary | ICD-10-CM | POA: Diagnosis not present

## 2017-10-11 MED ORDER — BECLOMETHASONE DIPROPIONATE 80 MCG/ACT IN AERS: INHALATION_SPRAY | RESPIRATORY_TRACT | 11 refills | Status: DC

## 2017-10-11 NOTE — Patient Instructions (Addendum)
I emphasized that nasal steroids (flonase/nasonex/ nasacort AQ)  have no immediate benefit in terms of improving symptoms.  To help them reached the target tissue, the patient should use Afrin 1-2  puffs every 12 hours applied one min before using the nasal steroids.  Afrin should be stopped after no more than 5 days.  If the symptoms worsen, Afrin can be restarted after 5 days off of therapy to prevent rebound congestion from overuse of Afrin.  I also emphasized that in no way are nasal steroids a concern in terms of "addiction".    Keep appt for your CT > Dr Herbie BaltimoreHarding is responsible for follow up  Follow up here can be as needed or meds can be refilled by PCP

## 2017-10-11 NOTE — Progress Notes (Signed)
Subjective:    Patient ID: [name], female    DOB: [birth year]    MRN: [id number]    Brief patient profile:  56 yowf never regular smoker lived in [place] where in 3rd grade  bad pna /prolonged admit and felt like recovered but since young adult tendency to head colds to end up in chest with worsening severity of cough > vomit with 2 episodes in 2016 the first in March 2016  and again in May 2016  referred by Dr [name] for recurrent cough with evidence of airflow obst on office spirometry 05/12/15 and pfts done 07/12/2015 c/w very mild chronic airflow obst with  Non reversible component.    History of Present Illness  05/17/2015 1st [facility] office visit/ [name]   Chief Complaint  Patient presents with  . Pulmonary Consult    Referred by Dr. Foy Howard. Pt c/o cough "for years" worse for the past 2 yrs with wheezing,    tends to cough/ clear throat sev times a day/ sometimes at hs but not typically first thing in am and usually nonproductive, hears herself wheeze When cough worse takes codeine and lots of cough drops / sometimes abx/ pred help/ assoc with intermittent HB/ takes prn ppi dulera tried/shaking and cough no better  rec Please remember to go to the x-ray department downstairs for your tests - we will call you with the results when they are available. Change prilosec 40 mg  Take  30-60 min before first meal of the day and Pepcid (famotidine)  20 mg one @  bedtime until return to office - this is the best way to tell whether stomach acid is contributing to your problem.   GERD  Diet  As needed ok to use xopenex hfa  Up to 2 pffs every 4 hours for breathing or coughing problems     07/12/2015 f/u ov/[name] re: mild persistent asthma  Chief Complaint  Patient presents with  . Follow-up    PFT done today. Pt states her cough has improved some. No new co's today.   was taking acid suppression prior to the last flare and had same severe head cold to chest symptoms  progression  Presently no symptoms and no need for xopenex at all  rec singulair 10 mg one daily typically taken in evening automatically  Please schedule a follow up visit in 6 months but call sooner if needed  If continues to flare needs sinus ct/ allergy profile and trial of qvar maint rx    09/28/2015  Acute extended  ov/[name] re:  Cough/sob  Despite  despite saba/qvar/ pred rx and new R pleuritic cp Chief Complaint  Patient presents with  . Acute Visit    Pt states developed "cold" that turned into bronchitis approx 2 wks ago- she was prescribed pred taper, qvar, and proair.  She c/o SOB, nausea, chest tightness, and insomnia.  acute onset oct 12th felt onset head cold from multiple fm members sick > chest cold > eval 09/23/15 > rx pred/qvar 80 one bid  /proair Stopped pred on 09/26/15 with onset R shoulder and R lat chest 09/26/15 / worse sob/cp  lying down  Cough better but still urge to clear throat, some green mucus ? pnds saba not really  helping sob rec Please see patient coordinator before you leave today  to schedule ct of sinus and chest asap> a/f level > augmentin x 10 days For pain or cough/ throat clearing > ultram 50 mg  1-2 every 4 hours as needed qvar 80 is one twice daily  Only use your albuterol as a rescue medication  -late add notified 08/25/2015 not filling  Singulair     10/21/2015  f/u ov/[name] re: asthma with prominent cough / ? uacs related to sinusitis  Chief Complaint  Patient presents with  . Follow-up    pt following for upper airway cough syndrome: pt states she is doing much better. pt states she still has a little dry cough but much better.  no c/o SOB, wheezing, or chest tightness. Pt has CT of sinus this morning.  Not limited by breathing from desired activities   rec Ok to leave off qvar and singulair but at onset of resp flare > first step is qvar 2 every 12 hours and if not improving after a week restart singulair  For drippy nose, itching /  sneezing > zyrtec      10/30/2016  f/u ov/[name] re: cough variant asthma/ Chief Complaint  Patient presents with  . Follow-up    asthma; pt states she has been doing well except when she gets a cold-most recent flare she used Qvar and previous instructions which seemed to help   last qvar Oct 2017 and planning trip to [place] with ? What to do if flare now that off the qvar s symptoms or need for saba  rec Start qvar one twice daily one week before your trip and at the first sign of any problem increase to 2 every 12 hours Work on inhaler technique:        01/16/2017  f/u ov/[name] re: cough variant asthma  Chief Complaint  Patient presents with  . Follow-up    feeling much better but has some residual cough  arrived back from [facility] 1/11/ 18 with flu Pos 2 d later rx tamiflu and started back on qvar 80 one bid  Then caught cold from grand daughter 2 weeks prior to OV  And rx augmentin  > 95+%   Better, some cough/ congestion esp in am with nasal congestion  And no need for albuterol No obvious day to day or daytime variability or now with assoc   purulent sputum or mucus plugs   subjective wheeze or overt   hb symptoms on prilosec 20 mg qd ac but no pepcid at hs   rec Stay on qvar until 100% better for at least 2 weeks with no supplements  At the onset of any respiratory flare > restart qvar 80 Take 2 puffs first thing in am and then another 2 puffs about 12 hours later.  For cough >  mucinex dm 1200 mg every 12 hours and add pepcid 20 mg at time     10/11/2017  f/u ov/[name] re: cough variant asthma/uacs/ only flares with apparent uri's/ has questions re TAA f/u Chief Complaint  Patient presents with  . Follow-up    Here to discuss repeat ct chest. Her breathing is doing well and she denies any co's.    has had several flares of cough controlled with qvar which she only takes when symptoms arise / mucinex dm and gerd rx otc but no need for maintenance ICS historically  Main chronic  concern is nasal congestion x years- had previously abn ct sinus 09/2015 rx with augmentin with f/u study 10/21/15 wnl  Not limited by breathing from desired activities    No obvious day to day or daytime variability or assoc excess/ purulent sputum or mucus plugs  or hemoptysis or cp or chest tightness, subjective wheeze or overt sinus or hb symptoms. No unusual exp hx or h/o childhood pna/ asthma or knowledge of premature birth.  Sleeping ok flat without nocturnal  or early am exacerbation  of respiratory  c/o's or need for noct saba. Also denies any obvious fluctuation of symptoms with weather or environmental changes or other aggravating or alleviating factors except as outlined above   Current Allergies, Complete Past Medical History, Past Surgical History, Family History, and Social History were reviewed in [facility] electronic medical record.  ROS  The following are not active complaints unless bolded Hoarseness, sore throat, dysphagia, dental problems, itching, sneezing,  nasal congestion or discharge of excess mucus or purulent secretions, ear ache,   fever, chills, sweats, unintended wt loss or wt gain, classically pleuritic or exertional cp,  orthopnea pnd or leg swelling, presyncope, palpitations, abdominal pain, anorexia, nausea, vomiting, diarrhea  or change in bowel habits or change in bladder habits, change in stools or change in urine, dysuria, hematuria,  rash, arthralgias, visual complaints, headache, numbness, weakness or ataxia or problems with walking or coordination,  change in mood/affect or memory.        Current Meds  Medication Sig  . aspirin 81 MG tablet Take 81 mg by mouth daily.  Marland Kitchen. b complex vitamins tablet Take 1 tablet by mouth daily.  . beclomethasone (QVAR) 80 MCG/ACT inhaler One twice daily  . chlorpheniramine-HYDROcodone (TUSSIONEX PENNKINETIC ER) 10-8 MG/5ML SUER Take 5 mLs by mouth every 12 (twelve) hours as needed for cough.  . cholecalciferol (VITAMIN  D) 1000 UNITS tablet Take 1,000 Units by mouth daily.  Marland Kitchen. FIBER DIET TABS Take 1 tablet by mouth daily.  Marland Kitchen. glucosamine-chondroitin 500-400 MG tablet Take 1 tablet by mouth daily.   . hydrochlorothiazide (HYDRODIURIL) 25 MG tablet Take 12.5 mg by mouth daily.   Marland Kitchen. LECITHIN PO Take 1 tablet by mouth daily.  . Multiple Vitamin (MULTIVITAMIN) capsule Take 1 capsule by mouth daily.  Marland Kitchen. omeprazole (PRILOSEC) 20 MG capsule Take 20 mg by mouth daily.  Marland Kitchen. PREMARIN vaginal cream Use as directed  . PROAIR HFA 108 (90 Base) MCG/ACT inhaler Inhale 2 puffs into the lungs every 6 (six) hours as needed.  . RESTASIS 0.05 % ophthalmic emulsion Place 1 drop into both eyes 2 (two) times daily.   . traMADol (ULTRAM) 50 MG tablet 1-2 every 4 hours as needed for cough or pain  . vitamin C (ASCORBIC ACID) 250 MG tablet Take 250 mg by mouth daily.  .   beclomethasone (QVAR) 80 MCG/ACT inhaler One twice daily (Patient taking differently: One twice daily as needed)                  Objective:   Physical Exam  Pleasant but very anxious amb wf nad     10/21/2015    190  > 01/09/2016    190  > 10/30/2016   185 > 01/16/2017    179 > 10/11/2017  189     09/28/15 190 lb (86.183 kg)  07/12/15 190 lb (86.183 kg)  05/17/15 191 lb 6.4 oz (86.818 kg)    Vital signs reviewed  - Note on arrival 02 sats  96% on RA       HEENT: nl dentition  and oropharynx. Nl external ear canals without cough reflex - moderate bilateral non-specific turbinate edema     NECK :  without JVD/Nodes/TM/ nl carotid upstrokes bilaterally   LUNGS: no acc  muscle use,  Nl contour chest which is clear to A and P bilaterally without cough on insp or exp maneuvers   CV:  RRR  no s3 or murmur or increase in P2, and no edema   ABD:  soft and nontender with nl inspiratory excursion in the supine position. No bruits or organomegaly appreciated, bowel sounds nl  MS:  Nl gait/ ext warm without deformities, calf tenderness, cyanosis or clubbing No  obvious joint restrictions   SKIN: warm and dry without lesions    NEURO:  alert, approp, nl sensorium with  no motor or cerebellar deficits apparent.          Assessment & Plan:

## 2017-10-12 ENCOUNTER — Encounter: Payer: Self-pay | Admitting: Internal Medicine

## 2017-10-12 NOTE — Assessment & Plan Note (Signed)
-   trial rx for cyclical cough 05/18/2015 > resolved.  - trial of singulair 10 mg q pm 07/12/2015 >>> did not refill p one month  - sinus ct 09/28/2015 > Multifocal paranasal sinus disease. Air-fluid level in the left maxillary antrum. Mucosal thickening is most pronounced in the right maxillary antrum as well as in multiple ethmoid air cells on the right. Note that the aerated right frontal is opacified. There is a retention cyst in the left sphenoid sinus region> rec Augmentin 875 mg take one pill twice daily  X20 days  > then repeat CT Sinus done 10/21/2015 with resolution of a/f levels  - added flonase with prn afrin 10/11/2017   I reviewed   diagnosis and management of rhinitis including how to use topical steroids effectively and why it is necessary to treat nasal obstruction and inflammation   at  the same time to eradicate the cycle of inflammation causing obstruction which can lead to   infection causing inflammation and so forth and so on.... And I emphasized that nasal steroids have no immediate benefit in terms of improving symptoms.  To help them reached the target tissue, the patient should use Afrin two puffs every 12 hours applied one min before using the nasal steroids.  Afrin should be stopped after no more than 5 days.  If the symptoms worsen, Afrin can be restarted after 5 days off of therapy to prevent rebound congestion from overuse of Afrin.  I also emphasized that in no way are nasal steroids a concern in terms of "addiction".    pulmoanry f/u can be prn

## 2017-10-12 NOTE — Assessment & Plan Note (Signed)
Reviewed studies with pt/ made her aware Dr Elissa HeftyHarding's office has assumed responsibility for following this problem long term and looks like they have already scheduled the Ct for this month.

## 2017-10-12 NOTE — Assessment & Plan Note (Addendum)
Spirometry 05/12/15 abn f/v loop  - PFT's while asymptomatic 07/12/2015  FEV1  2.36 (82%) ratio 67 and no better p saba and dlco 79% corrects to 85%  - Singulair trial 07/12/2015 > notified 08/25/2015 not filling  - FENO 10/30/2016  =   32 off all rx x one month  - 10/30/2016  After extensive coaching HFA effectiveness =    90%  > continue qvar 1-2 bid - Allergy profile 10/30/2016 >  Eos 0.2 /  IgE  30 RAST Pos Cat > dog - FENO   12/25/16  = 16 on qvar 80 one bid    All goals of chronic asthma control met including optimal function and elimination of symptoms with minimal need for rescue therapy.  Contingencies discussed in full including contacting this office immediately if not controlling the symptoms using the rule of two's.     Each maintenance medication was reviewed in detail including most importantly the difference between maintenance and as needed and under what circumstances the prns are to be used.  Please see AVS for specific  Instructions which are unique to this visit and I personally typed out  which were reviewed in detail in writing with the patient and a copy provided.

## 2017-10-16 ENCOUNTER — Other Ambulatory Visit: Payer: Medicare Other | Admitting: *Deleted

## 2017-10-16 DIAGNOSIS — Z01818 Encounter for other preprocedural examination: Secondary | ICD-10-CM

## 2017-10-16 DIAGNOSIS — I77810 Thoracic aortic ectasia: Secondary | ICD-10-CM

## 2017-10-17 LAB — BASIC METABOLIC PANEL
BUN / CREAT RATIO: 22 (ref 12–28)
BUN: 17 mg/dL (ref 8–27)
CALCIUM: 9.4 mg/dL (ref 8.7–10.3)
CHLORIDE: 103 mmol/L (ref 96–106)
CO2: 25 mmol/L (ref 20–29)
CREATININE: 0.76 mg/dL (ref 0.57–1.00)
GFR calc non Af Amer: 81 mL/min/{1.73_m2} (ref >59)
GFR, EST AFRICAN AMERICAN: 94 mL/min/{1.73_m2} (ref >59)
Glucose: 86 mg/dL (ref 65–99)
Potassium: 4.1 mmol/L (ref 3.5–5.2)
Sodium: 143 mmol/L (ref 134–144)

## 2017-10-18 ENCOUNTER — Telehealth: Payer: Self-pay | Admitting: Internal Medicine

## 2017-10-18 MED ORDER — FLUTICASONE PROPIONATE HFA 110 MCG/ACT IN AERO: 2.0000 | INHALATION_SPRAY | Freq: Two times a day (BID) | RESPIRATORY_TRACT | 3 refills | Status: DC

## 2017-10-18 NOTE — Telephone Encounter (Signed)
Qvar no longer covered  Per MW switch to Flovent 110 2 puffs bid  Called [facility]

## 2017-10-21 ENCOUNTER — Ambulatory Visit (INDEPENDENT_AMBULATORY_CARE_PROVIDER_SITE_OTHER)
Admission: RE | Admit: 2017-10-21 | Discharge: 2017-10-21 | Disposition: A | Discharge status: Home / Self Care | Payer: Medicare Other | Source: Ambulatory Visit | Attending: Cardiology | Admitting: Cardiology

## 2017-10-21 DIAGNOSIS — I77810 Thoracic aortic ectasia: Secondary | ICD-10-CM

## 2017-10-21 MED ORDER — IOPAMIDOL (ISOVUE-370) INJECTION 76%
100.0000 mL | Freq: Once | INTRAVENOUS | Status: AC | PRN
  Administered 2017-10-21: 100 mL via INTRAVENOUS

## 2017-10-31 ENCOUNTER — Telehealth: Payer: Self-pay | Admitting: Cardiology

## 2017-10-31 DIAGNOSIS — I712 Thoracic aortic aneurysm, without rupture, unspecified: Secondary | ICD-10-CM

## 2017-10-31 DIAGNOSIS — I77810 Thoracic aortic ectasia: Secondary | ICD-10-CM

## 2017-10-31 DIAGNOSIS — Z01818 Encounter for other preprocedural examination: Secondary | ICD-10-CM

## 2017-10-31 NOTE — Telephone Encounter (Signed)
CT IMPRESSION :not significantly changed compared to prior exam. Recommend annual imaging followup   Preliminary result given to pt, annual order needs to be entered

## 2017-10-31 NOTE — Telephone Encounter (Signed)
New message ° ° ° °Patient calling for CT results. Please call °

## 2017-11-01 NOTE — Telephone Encounter (Signed)
SPOKE TO PATIENT - SINCE PATIENT RECEIVED  RESULT  OF TEST FOR 2018 , OFFICE APPOINTMENT IS NOT NEEDED.   PLACED ORDER FOR NEXT YEAR CT ANGIO - AORTA-- NOV 2019 / LAB BMP AND   PATIENT WILL NEED FOLLOW UP OFFICE APPOINTMENT.   PATIENT VERBALIZING UNDERSTANDING.

## 2017-11-01 NOTE — Telephone Encounter (Signed)
PT NOTIFIED SHE WILL WAIT TO HAVE ANNUAL IN November 2019

## 2017-11-01 NOTE — Telephone Encounter (Signed)
Result sent to PCP via Epic

## 2017-11-01 NOTE — Telephone Encounter (Signed)
Stable CT angiogram from last year.  This would usually be followed up for at least another study next year.  If stable then, can probably reduce to every other year.  Bryan Lemmaavid Bridgette Wolden, MD

## 2017-11-01 NOTE — Telephone Encounter (Signed)
Lm2cb 

## 2018-09-29 ENCOUNTER — Other Ambulatory Visit: Payer: Self-pay | Admitting: *Deleted

## 2018-09-29 ENCOUNTER — Other Ambulatory Visit: Payer: Medicare Other

## 2018-09-29 DIAGNOSIS — I77810 Thoracic aortic ectasia: Secondary | ICD-10-CM

## 2018-09-29 DIAGNOSIS — Z01818 Encounter for other preprocedural examination: Secondary | ICD-10-CM

## 2018-09-29 DIAGNOSIS — I712 Thoracic aortic aneurysm, without rupture, unspecified: Secondary | ICD-10-CM

## 2018-09-30 LAB — BASIC METABOLIC PANEL
BUN/Creatinine Ratio: 27 (ref 12–28)
BUN: 21 mg/dL (ref 8–27)
CO2: 25 mmol/L (ref 20–29)
Calcium: 9.9 mg/dL (ref 8.7–10.3)
Chloride: 102 mmol/L (ref 96–106)
Creatinine, Ser: 0.79 mg/dL (ref 0.57–1.00)
GFR calc Af Amer: 89 mL/min/{1.73_m2} (ref >59)
GFR calc non Af Amer: 77 mL/min/{1.73_m2} (ref >59)
GLUCOSE: 89 mg/dL (ref 65–99)
Potassium: 3.7 mmol/L (ref 3.5–5.2)
Sodium: 141 mmol/L (ref 134–144)

## 2018-10-03 HISTORY — PX: OTHER SURGICAL HISTORY: SHX169

## 2018-10-06 ENCOUNTER — Ambulatory Visit (INDEPENDENT_AMBULATORY_CARE_PROVIDER_SITE_OTHER)
Admission: RE | Admit: 2018-10-06 | Discharge: 2018-10-06 | Disposition: A | Discharge status: Home / Self Care | Payer: Medicare Other | Source: Ambulatory Visit | Attending: Cardiology | Admitting: Cardiology

## 2018-10-06 DIAGNOSIS — I77810 Thoracic aortic ectasia: Secondary | ICD-10-CM

## 2018-10-06 DIAGNOSIS — I712 Thoracic aortic aneurysm, without rupture, unspecified: Secondary | ICD-10-CM

## 2018-10-06 MED ORDER — IOPAMIDOL (ISOVUE-370) INJECTION 76%
100.0000 mL | Freq: Once | INTRAVENOUS | Status: AC | PRN
  Administered 2018-10-06: 100 mL via INTRAVENOUS

## 2018-10-09 ENCOUNTER — Ambulatory Visit: Payer: Medicare Other | Admitting: Cardiology

## 2018-10-09 ENCOUNTER — Encounter: Payer: Self-pay | Admitting: Cardiology

## 2018-10-09 VITALS — BP 132/84 | HR 73 | Ht 69.0 in | Wt 189.0 lb

## 2018-10-09 DIAGNOSIS — I77810 Thoracic aortic ectasia: Secondary | ICD-10-CM | POA: Diagnosis not present

## 2018-10-09 DIAGNOSIS — I712 Thoracic aortic aneurysm, without rupture, unspecified: Secondary | ICD-10-CM

## 2018-10-09 DIAGNOSIS — R011 Cardiac murmur, unspecified: Secondary | ICD-10-CM

## 2018-10-09 DIAGNOSIS — Z01810 Encounter for preprocedural cardiovascular examination: Secondary | ICD-10-CM | POA: Diagnosis not present

## 2018-10-09 NOTE — Assessment & Plan Note (Signed)
Pretty much stable since 2017 at 3.8 to 4.0 cm.  At this time I think we need to check at least one more time next year.  I will also add a 2D echocardiogram at that time since I did hear soft murmur today.  Nothing that would need to be done prior to upcoming surgery but specifically to have a baseline of cardiac function as well as aortic valve function.  Plan: Follow-up CTA in November 2020 along with 2D echocardiogram at that time.

## 2018-10-09 NOTE — Progress Notes (Signed)
PCP: Joycelyn Rua, MD  PULMONOLOGY: Dr. Sherene Sires   Clinic Note: Chief Complaint  Patient presents with  . Follow-up    Chest CT for thoracic aortic dilation  . Pre-op Exam    Hip surgery    HPI: [name] is a 67 y.o. female with a PMH below who presents today for annual cardiology evaluation for ~ dilated thoracic Aorta on CT of chest.. She has a history of asthma and had recent flu in January 2018 , by an asthma exacerbation. She also has upcoming hip surgery and needs cardiology clearance.  Recent Hospitalizations: n/a  Studies Reviewed:   CT angiogram of the chest November 2018: 4 cm ascending thoracic aortic aneurysm not significantly changed compared to prior exam.  CTA Chest 10/06/18: Stable mildly dilated/top-normal caliber of the ascending thoracic aorta measuring 4 cm in greatest transverse diameter;  no significant coronary artery calcification.  Interval History: [name] is essentially stable from a cardiac standpoint with the exception noticing some occasional occasional fluttering sensations in her chest.  Nothing prolonged lasting more than a few seconds.  No rapid irregular heartbeats palpitations.  She denies any chest tightness pressure rest or exertion.  No lightheadedness, dizziness or syncope/near syncope.  No TIA or amaurosis fugax.  No PND, orthopnea or edema.  She has allergy related asthma type symptoms and can have some chest tightness and dyspnea when these occur, but not routinely and not necessarily associate with exercise.   Right now, she is not really able to do much of any Activity because of her hip pain.  No real exercise, but routinely does not notice exertional dyspnea or chest pain.  ROS: A comprehensive was performed. Review of Systems  Constitutional: Negative for malaise/fatigue.  HENT: Negative for congestion and nosebleeds.   Eyes: Negative.   Respiratory: Positive for wheezing (asthma). Negative for cough and sputum production.     Cardiovascular: Negative for claudication and leg swelling.       Per history of present illness  Gastrointestinal: Negative for blood in stool and melena.  Genitourinary: Negative for hematuria.  Skin: Negative.   Neurological: Negative for dizziness, focal weakness, seizures and weakness.  Psychiatric/Behavioral: Negative.     Past Medical History:  Diagnosis Date  . GERD (gastroesophageal reflux disease)   . Hypertension    History of childhood pneumonia in third grade  Past Surgical History:  Procedure Laterality Date  . APPENDECTOMY  2012  . bladder disection    . CESAREAN SECTION     x 2  . Chest CTA  10/2018   Stable mildly dilated/top-normal caliber of the ascending thoracic aorta measuring 4 cm in greatest transverse diameter;  no significant coronary artery calcification.  . COLON RESECTION  1982   1982  . HERNIA REPAIR      Current Meds  Medication Sig  . aspirin 81 MG tablet Take 81 mg by mouth daily.  Marland Kitchen b complex vitamins tablet Take 1 tablet by mouth daily.  . cholecalciferol (VITAMIN D) 1000 UNITS tablet Take 1,000 Units by mouth daily.  Marland Kitchen FIBER DIET TABS Take 1 tablet by mouth daily.  Marland Kitchen glucosamine-chondroitin 500-400 MG tablet Take 1 tablet by mouth daily.   . hydrochlorothiazide (HYDRODIURIL) 25 MG tablet Take 12.5 mg by mouth daily.   Marland Kitchen LECITHIN PO Take 1 tablet by mouth daily.  . Multiple Vitamin (MULTIVITAMIN) capsule Take 1 capsule by mouth daily.  Marland Kitchen omeprazole (PRILOSEC) 20 MG capsule Take 20 mg by mouth daily.  Marland Kitchen  PREMARIN vaginal cream Use as directed  . PROAIR HFA 108 (90 Base) MCG/ACT inhaler Inhale 2 puffs into the lungs every 6 (six) hours as needed.  . RESTASIS 0.05 % ophthalmic emulsion Place 1 drop into both eyes 2 (two) times daily.   . vitamin C (ASCORBIC ACID) 250 MG tablet Take 250 mg by mouth daily.    Allergies  Allergen Reactions  . Dulera [Mometasone Furo-Formoterol Fum] Palpitations    Social History   Tobacco Use  .  Smoking status: Former Smoker    Packs/day: 1.00    Years: 5.00    Pack years: 5.00    Types: Cigarettes    Last attempt to quit: 12/03/1973    Years since quitting: 44.8  . Smokeless tobacco: Never Used  Substance Use Topics  . Alcohol use: Yes    Alcohol/week: 0.0 standard drinks  . Drug use: No   Social History   Social History Narrative  . Not on file     family history includes Asthma in her brother, brother, and paternal grandfather; Clotting disorder in her mother. There is no family history that she can note of connective tissue disorder or aortic aneurysm. She has not had any history of valvular disease.  Wt Readings from Last 3 Encounters:  10/09/18 189 lb (85.7 kg)  10/11/17 189 lb 3.2 oz (85.8 kg)  01/16/17 179 lb (81.2 kg)    PHYSICAL EXAM BP 132/84   Pulse 73   Ht 5\' 9"  (1.753 m)   Wt 189 lb (85.7 kg)   BMI 27.91 kg/m   Physical Exam  Constitutional: She is oriented to person, place, and time. She appears well-developed and well-nourished. No distress.  Well-groomed.  Healthy-appearing.  Walks in with cane  HENT:  Head: Normocephalic and atraumatic.  Neck: Normal range of motion. Neck supple. No hepatojugular reflux and no JVD present. Carotid bruit is not present.  Cardiovascular: Regular rhythm and intact distal pulses.  Occasional extrasystoles are present. PMI is not displaced (Unable to palpate). Exam reveals distant heart sounds. Exam reveals no gallop and no friction rub.  Murmur heard.  Low-pitched harsh early systolic murmur is present with a grade of 1/6 at the upper right sternal border radiating to the neck. Pulmonary/Chest: Effort normal and breath sounds normal. No respiratory distress. She has no wheezes. She has no rales. She exhibits tenderness.  Abdominal: Soft. Bowel sounds are normal.  Musculoskeletal: Normal range of motion. She exhibits no edema.  Slow antalgic gait with cane  Lymphadenopathy:    She has no cervical adenopathy.    Neurological: She is alert and oriented to person, place, and time.  Psychiatric: She has a normal mood and affect. Her behavior is normal. Judgment and thought content normal.  Vitals reviewed.   Adult ECG Report  Rate: 74 ;  Rhythm: normal sinus rhythm and indeterminate; normal axis, intervals and durations  Narrative Interpretation: normal EKG   Other studies Reviewed: Additional studies/ records that were reviewed today include:  Recent Labs:  n/a   ASSESSMENT / PLAN: Problem List Items Addressed This Visit    Preoperative cardiovascular examination    Clearance for hip surgery.  Relatively intermediate risk surgery from a cardiac standpoint. She did not not have any evidence of significant CAD on CT scan and has not had any angina symptoms.  Has had no heart failure symptoms to suggest cardiac myopathy.  Nondiabetic.  Normal renal function. Based on this information, revised cardiac risk index would be low risk  patient for a low-intermediate risk surgery.  Would not do any further cardiac evaluation prior to operation as this would delay surgery.  She is able to do more than 4 metabolic equivalents without any chest pain or pressure.  No exertional dyspnea.  Would not do a stress test prior to surgery as this would not change my I am checking a 2D echocardiogram next year, but that is solely to follow-up the aortic valve thoracic aorta using a non-CT scan approach.  Recommend proceeding to the OR without any further evaluation.  She is on aspirin which can be held for surgery if necessary.      Systolic ejection murmur    Soft SEM heard on exam.  This could just be mild sclerosis, however in the setting of thoracic aortic dilation, would need to exclude the possibility of an aortic jet. Currently in the process of preoperative assessment.  I do not think that there would be any evidence of significant aortic stenosis with a very soft murmur.  Should not preclude surgery.       Relevant Orders   ECHOCARDIOGRAM COMPLETE   CT ANGIO CHEST AORTA W &/OR WO CONTRAST   Thoracic aortic ectasia (HCC) - Primary (Chronic)    Pretty much stable since 2017 at 3.8 to 4.0 cm.  At this time I think we need to check at least one more time next year.  I will also add a 2D echocardiogram at that time since I did hear soft murmur today.  Nothing that would need to be done prior to upcoming surgery but specifically to have a baseline of cardiac function as well as aortic valve function.  Plan: Follow-up CTA in November 2020 along with 2D echocardiogram at that time.      Relevant Orders   ECHOCARDIOGRAM COMPLETE   CT ANGIO CHEST AORTA W &/OR WO CONTRAST    Other Visit Diagnoses    Thoracic aortic aneurysm without rupture (HCC)       Relevant Orders   CT ANGIO CHEST AORTA W &/OR WO CONTRAST      Current medicines are reviewed at length with the patient today. (+/- concerns) n/a The following changes have been made: n/a  Patient Instructions  Medication Instructions:  NOT NEEDED If you need a refill on your cardiac medications before your next appointment, please call your pharmacy.   Lab work: NOT NEEDED If you have labs (blood work) drawn today and your tests are completely normal, you will receive your results only by: Marland Kitchen MyChart Message (if you have MyChart) OR . A paper copy in the mail If you have any lab test that is abnormal or we need to change your treatment, we will call you to review the results.  Testing/Procedures:  SCHEDULE BOTH  FOR NOV 2020 Your physician has requested that you have an echocardiogram. Echocardiography is a painless test that uses sound waves to create images of your heart. It provides your doctor with information about the size and shape of your heart and how well your heart's chambers and valves are working. This procedure takes approximately one hour. There are no restrictions for this procedure.  AND CHEST - THORACIC AORTA    Non-Cardiac CT scanning, (CAT scanning), is a noninvasive, special x-ray that produces cross-sectional images of the body using x-rays and a computer. CT scans help physicians diagnose and treat medical conditions. For some CT exams, a contrast material is used to enhance visibility in the area of the body being studied.  CT scans provide greater clarity and reveal more details than regular x-ray exams.   Follow-Up: At [facility], you and your health needs are our priority.  As part of our continuing mission to provide you with exceptional heart care, we have created designated Provider Care Teams.  These Care Teams include your primary Cardiologist (physician) and Advanced Practice Providers (APPs -  Physician Assistants and Nurse Practitioners) who all work together to provide you with the care you need, when you need it. You will need a follow up appointment in 12 months.  Please call our office 2 months in advance to schedule this appointment.  You may see Bryan Lemma, MD or one of the following Advanced Practice Providers on your designated Care Team:   Theodore Demark, PA-C . Joni Reining, DNP, ANP  Any Other Special Instructions Will Be Listed Below (If Applicable).   YOU HAVE CARDIAC CLEARANCE FOR RIGHT TOTAL HIP SURGERY    Studies Ordered:   Orders Placed This Encounter  Procedures  . CT ANGIO CHEST AORTA W &/OR WO CONTRAST  . ECHOCARDIOGRAM COMPLETE      Bryan Lemma, M.D., M.S. Interventional Cardiologist   Pager # [phone] Phone # [phone] [address] [place], [place] [address]

## 2018-10-09 NOTE — Assessment & Plan Note (Signed)
Clearance for hip surgery.  Relatively intermediate risk surgery from a cardiac standpoint. She did not not have any evidence of significant CAD on CT scan and has not had any angina symptoms.  Has had no heart failure symptoms to suggest cardiac myopathy.  Nondiabetic.  Normal renal function. Based on this information, revised cardiac risk index would be low risk patient for a low-intermediate risk surgery.  Would not do any further cardiac evaluation prior to operation as this would delay surgery.  She is able to do more than 4 metabolic equivalents without any chest pain or pressure.  No exertional dyspnea.  Would not do a stress test prior to surgery as this would not change my I am checking a 2D echocardiogram next year, but that is solely to follow-up the aortic valve thoracic aorta using a non-CT scan approach.  Recommend proceeding to the OR without any further evaluation.  She is on aspirin which can be held for surgery if necessary.

## 2018-10-09 NOTE — Assessment & Plan Note (Signed)
Soft SEM heard on exam.  This could just be mild sclerosis, however in the setting of thoracic aortic dilation, would need to exclude the possibility of an aortic jet. Currently in the process of preoperative assessment.  I do not think that there would be any evidence of significant aortic stenosis with a very soft murmur.  Should not preclude surgery.

## 2018-10-09 NOTE — Patient Instructions (Addendum)
Medication Instructions:  NOT NEEDED If you need a refill on your cardiac medications before your next appointment, please call your pharmacy.   Lab work: NOT NEEDED If you have labs (blood work) drawn today and your tests are completely normal, you will receive your results only by: Marland Kitchen MyChart Message (if you have MyChart) OR . A paper copy in the mail If you have any lab test that is abnormal or we need to change your treatment, we will call you to review the results.  Testing/Procedures:  SCHEDULE BOTH  FOR NOV 2020 Your physician has requested that you have an echocardiogram. Echocardiography is a painless test that uses sound waves to create images of your heart. It provides your doctor with information about the size and shape of your heart and how well your heart's chambers and valves are working. This procedure takes approximately one hour. There are no restrictions for this procedure.  AND CHEST - THORACIC AORTA  Non-Cardiac CT scanning, (CAT scanning), is a noninvasive, special x-ray that produces cross-sectional images of the body using x-rays and a computer. CT scans help physicians diagnose and treat medical conditions. For some CT exams, a contrast material is used to enhance visibility in the area of the body being studied. CT scans provide greater clarity and reveal more details than regular x-ray exams.   Follow-Up: At [facility], you and your health needs are our priority.  As part of our continuing mission to provide you with exceptional heart care, we have created designated Provider Care Teams.  These Care Teams include your primary Cardiologist (physician) and Advanced Practice Providers (APPs -  Physician Assistants and Nurse Practitioners) who all work together to provide you with the care you need, when you need it. You will need a follow up appointment in 12 months.  Please call our office 2 months in advance to schedule this appointment.  You may see Bryan Lemma,  MD or one of the following Advanced Practice Providers on your designated Care Team:   Theodore Demark, PA-C . Joni Reining, DNP, ANP  Any Other Special Instructions Will Be Listed Below (If Applicable).   YOU HAVE CARDIAC CLEARANCE FOR RIGHT TOTAL HIP SURGERY

## 2018-10-13 NOTE — H&P (Signed)
TOTAL HIP ADMISSION H&P  Patient is admitted for right total hip arthroplasty, anterior approach.  Subjective:  Chief Complaint:   Right hip primary OA / pain  HPI: [name], 67 y.o. female, has a history of pain and functional disability in the right hip(s) due to arthritis and patient has failed non-surgical conservative treatments for greater than 12 weeks to include NSAID's and/or analgesics, corticosteriod injections, use of assistive devices and activity modification.  Onset of symptoms was gradual starting >10 years ago with gradually worsening course since that time.The patient noted no past surgery on the right hip(s).  Patient currently rates pain in the right hip at 9 out of 10 with activity. Patient has night pain, worsening of pain with activity and weight bearing, trendelenberg gait, pain that interfers with activities of daily living and pain with passive range of motion. Patient has evidence of periarticular osteophytes and joint space narrowing by imaging studies. This condition presents safety issues increasing the risk of falls.  There is no current active infection.  Risks, benefits and expectations were discussed with the patient.  Risks including but not limited to the risk of anesthesia, blood clots, nerve damage, blood vessel damage, failure of the prosthesis, infection and up to and including death.  Patient understand the risks, benefits and expectations and wishes to proceed with surgery.   PCP: Joycelyn Rua, MD  D/C Plans:       Home  Post-op Meds:       No Rx given   Tranexamic Acid:      To be given - IV   Decadron:      Is to be given  FYI:      ASA  Norco  DME:    Rx given for - RW   PT:    No PT    Patient Active Problem List   Diagnosis Date Noted  . Systolic ejection murmur 10/09/2018  . Preoperative cardiovascular examination 10/09/2018  . Thoracic aortic ectasia (HCC) 01/07/2017  . Mild persistent chronic asthma without complication  05/18/2015  . Upper airway cough syndrome 05/17/2015   Past Medical History:  Diagnosis Date  . GERD (gastroesophageal reflux disease)   . Hypertension     Past Surgical History:  Procedure Laterality Date  . APPENDECTOMY  2012  . bladder disection    . CESAREAN SECTION     x 2  . Chest CTA  10/2018   Stable mildly dilated/top-normal caliber of the ascending thoracic aorta measuring 4 cm in greatest transverse diameter;  no significant coronary artery calcification.  . COLON RESECTION  1982   1982  . HERNIA REPAIR      No current facility-administered medications for this encounter.    Current Outpatient Medications  Medication Sig Dispense Refill Last Dose  . aspirin 81 MG tablet Take 81 mg by mouth daily.   Taking  . b complex vitamins tablet Take 1 tablet by mouth daily.   Taking  . cholecalciferol (VITAMIN D) 1000 UNITS tablet Take 1,000 Units by mouth daily.   Taking  . FIBER DIET TABS Take 1 tablet by mouth daily.   Taking  . glucosamine-chondroitin 500-400 MG tablet Take 1 tablet by mouth daily.    Taking  . hydrochlorothiazide (HYDRODIURIL) 25 MG tablet Take 12.5 mg by mouth daily.    Taking  . LECITHIN PO Take 1 tablet by mouth daily.   Taking  . Multiple Vitamin (MULTIVITAMIN) capsule Take 1 capsule by mouth daily.  Taking  . omeprazole (PRILOSEC) 20 MG capsule Take 20 mg by mouth daily.   Taking  . PREMARIN vaginal cream Use as directed   Taking  . PROAIR HFA 108 (90 Base) MCG/ACT inhaler Inhale 2 puffs into the lungs every 6 (six) hours as needed. 1 Inhaler 5 Taking  . RESTASIS 0.05 % ophthalmic emulsion Place 1 drop into both eyes 2 (two) times daily.    Taking  . traMADol (ULTRAM) 50 MG tablet 1-2 every 4 hours as needed for cough or pain (Patient not taking: Reported on 10/09/2018) 40 tablet 0 Not Taking  . vitamin C (ASCORBIC ACID) 250 MG tablet Take 250 mg by mouth daily.   Taking   Allergies  Allergen Reactions  . Dulera [Mometasone Furo-Formoterol Fum]  Palpitations    Social History   Tobacco Use  . Smoking status: Former Smoker    Packs/day: 1.00    Years: 5.00    Pack years: 5.00    Types: Cigarettes    Last attempt to quit: 12/03/1973    Years since quitting: 44.8  . Smokeless tobacco: Never Used  Substance Use Topics  . Alcohol use: Yes    Alcohol/week: 0.0 standard drinks    Family History  Problem Relation Age of Onset  . Asthma Brother   . Asthma Paternal Grandfather   . Clotting disorder Mother   . Asthma Brother      Review of Systems  Constitutional: Negative.   HENT: Negative.   Eyes: Negative.   Respiratory: Positive for cough.   Cardiovascular: Negative.   Gastrointestinal: Positive for heartburn.  Genitourinary: Positive for urgency.  Musculoskeletal: Positive for joint pain.  Skin: Negative.   Neurological: Negative.   Endo/Heme/Allergies: Negative.   Psychiatric/Behavioral: Negative.     Objective:  Physical Exam  Constitutional: She is oriented to person, place, and time. She appears well-developed.  HENT:  Head: Normocephalic.  Eyes: Pupils are equal, round, and reactive to light.  Neck: Neck supple. No JVD present. No tracheal deviation present. No thyromegaly present.  Cardiovascular: Normal rate, regular rhythm and intact distal pulses.  Respiratory: Effort normal and breath sounds normal. No respiratory distress. She has no wheezes.  GI: Soft. There is no tenderness. There is no guarding.  Musculoskeletal:       Right hip: She exhibits decreased range of motion, decreased strength, tenderness and bony tenderness. She exhibits no swelling, no deformity and no laceration.  Lymphadenopathy:    She has no cervical adenopathy.  Neurological: She is alert and oriented to person, place, and time. 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  Skin: Skin is warm and dry.  Psychiatric: She has a normal mood and affect.      Labs:  Estimated body  mass index is 27.91 kg/m as calculated from the following:   Height as of 10/09/18: 5\' 9"  (1.753 m).   Weight as of 10/09/18: 85.7 kg.   Imaging Review Plain radiographs demonstrate severe degenerative joint disease of the right hip(s). The bone quality appears to be good for age and reported activity level.    Preoperative templating of the joint replacement has been completed, documented, and submitted to the Operating Room personnel in order to optimize intra-operative equipment management.     Assessment/Plan:  End stage arthritis, right hip  The patient history, physical examination, clinical judgement of the provider and imaging studies are consistent with end stage degenerative joint disease of the right hip and total hip  arthroplasty is deemed medically necessary. The treatment options including medical management, injection therapy, arthroscopy and arthroplasty were discussed at length. The risks and benefits of total hip arthroplasty were presented and reviewed. The risks due to aseptic loosening, infection, stiffness, dislocation/subluxation,  thromboembolic complications and other imponderables were discussed.  The patient acknowledged the explanation, agreed to proceed with the plan and consent was signed. Patient is being admitted for inpatient treatment for surgery, pain control, PT, OT, prophylactic antibiotics, VTE prophylaxis, progressive ambulation and ADL's and discharge planning.The patient is planning to be discharged home.     Anastasio Auerbach Navon Kotowski   PA-C  10/13/2018, 2:13 PM

## 2018-10-13 NOTE — Addendum Note (Signed)
Addended by: Tobin Chad on: 10/13/2018 09:02 AM   Modules accepted: Orders

## 2018-10-23 ENCOUNTER — Inpatient Hospital Stay: Admission: RE | Admit: 2018-10-23 | Payer: Medicare Other | Source: Ambulatory Visit

## 2018-10-23 NOTE — Progress Notes (Signed)
Cardiac clearance Dr Herbie BaltimoreHarding on chart   EKG 10-19-18 epic    CT angio 10-06-18 epic

## 2018-10-23 NOTE — Patient Instructions (Signed)
Joanne SorrowMary H Howard  10/23/2018   Your procedure is scheduled on: 10-28-18   [facility]    Report to admitting at 6:00AM    Call this number if you have problems the morning of surgery [phone]     Remember: Do not eat food or drink liquids :After Midnight. BRUSH YOUR TEETH MORNING OF SURGERY AND RINSE YOUR MOUTH OUT, NO CHEWING GUM CANDY OR MINTS.     Take these medicines the morning of surgery with A SIP OF WATER: omeprazole, rastasis eye drops                                 You may not have any metal on your body including hair pins and              piercings  Do not wear jewelry, make-up, lotions, powders or perfumes, deodorant             Do not wear nail polish.  Do not shave  48 hours prior to surgery.                Do not bring valuables to the hospital. [facility] IS NOT             RESPONSIBLE   FOR VALUABLES.  Contacts, dentures or bridgework may not be worn into surgery.  Leave suitcase in the car. After surgery it may be brought to your room.                  Please read over the following fact sheets you were given: _____________________________________________________________________             [facility] - Preparing for Surgery Before surgery, you can play an important role.  Because skin is not sterile, your skin needs to be as free of germs as possible.  You can reduce the number of germs on your skin by washing with CHG (chlorahexidine gluconate) soap before surgery.  CHG is an antiseptic cleaner which kills germs and bonds with the skin to continue killing germs even after washing. Please DO NOT use if you have an allergy to CHG or antibacterial soaps.  If your skin becomes reddened/irritated stop using the CHG and inform your nurse when you arrive at Short Stay. Do not shave (including legs and underarms) for at least 48 hours prior to the first CHG shower.  You may shave your face/neck. Please  follow these instructions carefully:  1.  Shower with CHG Soap the night before surgery and the  morning of Surgery.  2.  If you choose to wash your hair, wash your hair first as usual with your  normal  shampoo.  3.  After you shampoo, rinse your hair and body thoroughly to remove the  shampoo.                           4.  Use CHG as you would any other liquid soap.  You can apply chg directly  to the skin and wash                       Gently with a scrungie or clean washcloth.  5.  Apply the CHG Soap to your body ONLY FROM THE  NECK DOWN.   Do not use on face/ open                           Wound or open sores. Avoid contact with eyes, ears mouth and genitals (private parts).                       Wash face,  Genitals (private parts) with your normal soap.             6.  Wash thoroughly, paying special attention to the area where your surgery  will be performed.  7.  Thoroughly rinse your body with warm water from the neck down.  8.  DO NOT shower/wash with your normal soap after using and rinsing off  the CHG Soap.                9.  Pat yourself dry with a clean towel.            10.  Wear clean pajamas.            11.  Place clean sheets on your bed the night of your first shower and do not  sleep with pets. Day of Surgery : Do not apply any lotions/deodorants the morning of surgery.  Please wear clean clothes to the hospital/surgery center.  FAILURE TO FOLLOW THESE INSTRUCTIONS MAY RESULT IN THE CANCELLATION OF YOUR SURGERY PATIENT SIGNATURE_________________________________  NURSE SIGNATURE__________________________________  ________________________________________________________________________   [name]  An incentive spirometer is a tool that can help keep your lungs clear and active. This tool measures how well you are filling your lungs with each breath. Taking long deep breaths may help reverse or decrease the chance of developing breathing (pulmonary) problems  (especially infection) following:  A long period of time when you are unable to move or be active. BEFORE THE PROCEDURE   If the spirometer includes an indicator to show your best effort, your nurse or respiratory therapist will set it to a desired goal.  If possible, sit up straight or lean slightly forward. Try not to slouch.  Hold the incentive spirometer in an upright position. INSTRUCTIONS FOR USE  1. Sit on the edge of your bed if possible, or sit up as far as you can in bed or on a chair. 2. Hold the incentive spirometer in an upright position. 3. Breathe out normally. 4. Place the mouthpiece in your mouth and seal your lips tightly around it. 5. Breathe in slowly and as deeply as possible, raising the piston or the ball toward the top of the column. 6. Hold your breath for 3-5 seconds or for as long as possible. Allow the piston or ball to fall to the bottom of the column. 7. Remove the mouthpiece from your mouth and breathe out normally. 8. Rest for a few seconds and repeat Steps 1 through 7 at least 10 times every 1-2 hours when you are awake. Take your time and take a few normal breaths between deep breaths. 9. The spirometer may include an indicator to show your best effort. Use the indicator as a goal to work toward during each repetition. 10. After each set of 10 deep breaths, practice coughing to be sure your lungs are clear. If you have an incision (the cut made at the time of surgery), support your incision when coughing by placing a pillow or rolled up towels firmly against it. Once you are able  to get out of bed, walk around indoors and cough well. You may stop using the incentive spirometer when instructed by your caregiver.  RISKS AND COMPLICATIONS  Take your time so you do not get dizzy or light-headed.  If you are in pain, you may need to take or ask for pain medication before doing incentive spirometry. It is harder to take a deep breath if you are having  pain. AFTER USE  Rest and breathe slowly and easily.  It can be helpful to keep track of a log of your progress. Your caregiver can provide you with a simple table to help with this. If you are using the spirometer at home, follow these instructions: [place] IF:   You are having difficultly using the spirometer.  You have trouble using the spirometer as often as instructed.  Your pain medication is not giving enough relief while using the spirometer.  You develop fever of 100.5 F (38.1 C) or higher. SEEK IMMEDIATE MEDICAL CARE IF:   You cough up bloody sputum that had not been present before.  You develop fever of 102 F (38.9 C) or greater.  You develop worsening pain at or near the incision site. MAKE SURE YOU:   Understand these instructions.  Will watch your condition.  Will get help right away if you are not doing well or get worse. Document Released: 04/01/2007 Document Revised: 02/11/2012 Document Reviewed: 06/02/2007 Exit[employer] Patient Information 2014 [employer], [place].   ________________________________________________________________________  WHAT IS A BLOOD TRANSFUSION? Blood Transfusion Information  A transfusion is the replacement of blood or some of its parts. Blood is made up of multiple cells which provide different functions.  Red blood cells carry oxygen and are used for blood loss replacement.  White blood cells fight against infection.  Platelets control bleeding.  Plasma helps clot blood.  Other blood products are available for specialized needs, such as hemophilia or other clotting disorders. BEFORE THE TRANSFUSION  Who gives blood for transfusions?   Healthy volunteers who are fully evaluated to make sure their blood is safe. This is blood bank blood. Transfusion therapy is the safest it has ever been in the practice of medicine. Before blood is taken from a donor, a complete history is taken to make sure that person has no history  of diseases nor engages in risky social behavior (examples are intravenous drug use or sexual activity with multiple partners). The donor's travel history is screened to minimize risk of transmitting infections, such as malaria. The donated blood is tested for signs of infectious diseases, such as HIV and hepatitis. The blood is then tested to be sure it is compatible with you in order to minimize the chance of a transfusion reaction. If you or a relative donates blood, this is often done in anticipation of surgery and is not appropriate for emergency situations. It takes many days to process the donated blood. RISKS AND COMPLICATIONS Although transfusion therapy is very safe and saves many lives, the main dangers of transfusion include:   Getting an infectious disease.  Developing a transfusion reaction. This is an allergic reaction to something in the blood you were given. Every precaution is taken to prevent this. The decision to have a blood transfusion has been considered carefully by your caregiver before blood is given. Blood is not given unless the benefits outweigh the risks. AFTER THE TRANSFUSION  Right after receiving a blood transfusion, you will usually feel much better and more energetic. This is especially true  if your red blood cells have gotten low (anemic). The transfusion raises the level of the red blood cells which carry oxygen, and this usually causes an energy increase.  The nurse administering the transfusion will monitor you carefully for complications. HOME CARE INSTRUCTIONS  No special instructions are needed after a transfusion. You may find your energy is better. Speak with your caregiver about any limitations on activity for underlying diseases you may have. SEEK MEDICAL CARE IF:   Your condition is not improving after your transfusion.  You develop redness or irritation at the intravenous (IV) site. SEEK IMMEDIATE MEDICAL CARE IF:  Any of the following symptoms  occur over the next 12 hours:  Shaking chills.  You have a temperature by mouth above 102 F (38.9 C), not controlled by medicine.  Chest, back, or muscle pain.  People around you feel you are not acting correctly or are confused.  Shortness of breath or difficulty breathing.  Dizziness and fainting.  You get a rash or develop hives.  You have a decrease in urine output.  Your urine turns a dark color or changes to pink, red, or brown. Any of the following symptoms occur over the next 10 days:  You have a temperature by mouth above 102 F (38.9 C), not controlled by medicine.  Shortness of breath.  Weakness after normal activity.  The white part of the eye turns yellow (jaundice).  You have a decrease in the amount of urine or are urinating less often.  Your urine turns a dark color or changes to pink, red, or brown. Document Released: 11/16/2000 Document Revised: 02/11/2012 Document Reviewed: 07/05/2008 [facility] Patient Information 2014 [place], [place].  _______________________________________________________________________

## 2018-10-24 ENCOUNTER — Other Ambulatory Visit: Payer: Self-pay

## 2018-10-24 ENCOUNTER — Encounter (HOSPITAL_COMMUNITY)
Admission: RE | Admit: 2018-10-24 | Discharge: 2018-10-24 | Disposition: A | Discharge status: Home / Self Care | Payer: Medicare Other | Source: Ambulatory Visit | Attending: Orthopedic Surgery | Admitting: Orthopedic Surgery

## 2018-10-24 ENCOUNTER — Encounter (HOSPITAL_COMMUNITY): Payer: Self-pay

## 2018-10-24 DIAGNOSIS — Z01812 Encounter for preprocedural laboratory examination: Secondary | ICD-10-CM | POA: Insufficient documentation

## 2018-10-24 HISTORY — DX: Adverse effect of unspecified anesthetic, initial encounter: T41.45XA

## 2018-10-24 HISTORY — DX: Unspecified osteoarthritis, unspecified site: M19.90

## 2018-10-24 HISTORY — DX: Thoracic aortic aneurysm, without rupture: I71.2

## 2018-10-24 HISTORY — DX: Other complications of anesthesia, initial encounter: T88.59XA

## 2018-10-24 LAB — CBC
HEMATOCRIT: 41.3 % (ref 36.0–46.0)
HEMOGLOBIN: 12.9 g/dL (ref 12.0–15.0)
MCH: 27.4 pg (ref 26.0–34.0)
MCHC: 31.2 g/dL (ref 30.0–36.0)
MCV: 87.9 fL (ref 80.0–100.0)
Platelets: 277 10*3/uL (ref 150–400)
RBC: 4.7 MIL/uL (ref 3.87–5.11)
RDW: 13.9 % (ref 11.5–15.5)
WBC: 10.1 10*3/uL (ref 4.0–10.5)
nRBC: 0 % (ref 0.0–0.2)

## 2018-10-24 LAB — SURGICAL PCR SCREEN
MRSA, PCR: NEGATIVE
STAPHYLOCOCCUS AUREUS: NEGATIVE

## 2018-10-24 LAB — ABO/RH: ABO/RH(D): A POS

## 2018-10-24 NOTE — Progress Notes (Signed)
BMP 10-20-18 on [facility] , patient brought copy to appt.

## 2018-10-28 ENCOUNTER — Other Ambulatory Visit: Payer: Self-pay

## 2018-10-28 ENCOUNTER — Inpatient Hospital Stay (HOSPITAL_COMMUNITY): Payer: Medicare Other

## 2018-10-28 ENCOUNTER — Inpatient Hospital Stay (HOSPITAL_COMMUNITY): Payer: Medicare Other | Admitting: Certified Registered"

## 2018-10-28 ENCOUNTER — Encounter (HOSPITAL_COMMUNITY): Payer: Self-pay | Admitting: Certified Registered"

## 2018-10-28 ENCOUNTER — Encounter (HOSPITAL_COMMUNITY)
Admission: RE | Disposition: A | Discharge status: Home / Self Care | Payer: Self-pay | Source: Home / Self Care | Attending: Orthopedic Surgery

## 2018-10-28 ENCOUNTER — Inpatient Hospital Stay (HOSPITAL_COMMUNITY)
Admission: RE | Admit: 2018-10-28 | Discharge: 2018-10-29 | DRG: 470 | Disposition: A | Discharge status: Home / Self Care | Payer: Medicare Other | Attending: Orthopedic Surgery | Admitting: Orthopedic Surgery

## 2018-10-28 DIAGNOSIS — M1611 Unilateral primary osteoarthritis, right hip: Secondary | ICD-10-CM | POA: Diagnosis present

## 2018-10-28 DIAGNOSIS — I739 Peripheral vascular disease, unspecified: Secondary | ICD-10-CM | POA: Diagnosis present

## 2018-10-28 DIAGNOSIS — I10 Essential (primary) hypertension: Secondary | ICD-10-CM | POA: Diagnosis present

## 2018-10-28 DIAGNOSIS — F419 Anxiety disorder, unspecified: Secondary | ICD-10-CM | POA: Diagnosis present

## 2018-10-28 DIAGNOSIS — R011 Cardiac murmur, unspecified: Secondary | ICD-10-CM | POA: Diagnosis present

## 2018-10-28 DIAGNOSIS — Z96649 Presence of unspecified artificial hip joint: Secondary | ICD-10-CM

## 2018-10-28 DIAGNOSIS — Z7982 Long term (current) use of aspirin: Secondary | ICD-10-CM

## 2018-10-28 DIAGNOSIS — I712 Thoracic aortic aneurysm, without rupture: Secondary | ICD-10-CM | POA: Diagnosis present

## 2018-10-28 DIAGNOSIS — Z96641 Presence of right artificial hip joint: Secondary | ICD-10-CM

## 2018-10-28 DIAGNOSIS — Z9049 Acquired absence of other specified parts of digestive tract: Secondary | ICD-10-CM

## 2018-10-28 DIAGNOSIS — E663 Overweight: Secondary | ICD-10-CM | POA: Diagnosis present

## 2018-10-28 DIAGNOSIS — Z79899 Other long term (current) drug therapy: Secondary | ICD-10-CM | POA: Diagnosis not present

## 2018-10-28 DIAGNOSIS — Z87891 Personal history of nicotine dependence: Secondary | ICD-10-CM

## 2018-10-28 DIAGNOSIS — Z79891 Long term (current) use of opiate analgesic: Secondary | ICD-10-CM | POA: Diagnosis not present

## 2018-10-28 DIAGNOSIS — Z825 Family history of asthma and other chronic lower respiratory diseases: Secondary | ICD-10-CM

## 2018-10-28 DIAGNOSIS — K219 Gastro-esophageal reflux disease without esophagitis: Secondary | ICD-10-CM | POA: Diagnosis present

## 2018-10-28 DIAGNOSIS — M25751 Osteophyte, right hip: Secondary | ICD-10-CM | POA: Diagnosis present

## 2018-10-28 DIAGNOSIS — Z419 Encounter for procedure for purposes other than remedying health state, unspecified: Secondary | ICD-10-CM

## 2018-10-28 DIAGNOSIS — Z832 Family history of diseases of the blood and blood-forming organs and certain disorders involving the immune mechanism: Secondary | ICD-10-CM | POA: Diagnosis not present

## 2018-10-28 DIAGNOSIS — Z888 Allergy status to other drugs, medicaments and biological substances status: Secondary | ICD-10-CM

## 2018-10-28 DIAGNOSIS — Z6827 Body mass index (BMI) 27.0-27.9, adult: Secondary | ICD-10-CM | POA: Diagnosis not present

## 2018-10-28 HISTORY — PX: TOTAL HIP ARTHROPLASTY: SHX124

## 2018-10-28 LAB — TYPE AND SCREEN
ABO/RH(D): A POS
Antibody Screen: NEGATIVE

## 2018-10-28 SURGERY — ARTHROPLASTY, HIP, TOTAL, ANTERIOR APPROACH
Anesthesia: Spinal | Site: Hip | Laterality: Right

## 2018-10-28 MED ORDER — ONDANSETRON HCL 4 MG/2ML IJ SOLN
INTRAMUSCULAR | Status: DC | PRN
  Administered 2018-10-28: 4 mg via INTRAVENOUS

## 2018-10-28 MED ORDER — HYDROCODONE-ACETAMINOPHEN 7.5-325 MG PO TABS: 1.0000 | ORAL_TABLET | ORAL | 0 refills | Status: DC | PRN

## 2018-10-28 MED ORDER — POLYETHYLENE GLYCOL 3350 17 G PO PACK: 17.0000 g | PACK | Freq: Two times a day (BID) | ORAL | 0 refills | Status: DC

## 2018-10-28 MED ORDER — ONDANSETRON HCL 4 MG PO TABS: 4.0000 mg | ORAL_TABLET | Freq: Four times a day (QID) | ORAL | Status: DC | PRN

## 2018-10-28 MED ORDER — PROMETHAZINE HCL 25 MG/ML IJ SOLN: 6.2500 mg | INTRAMUSCULAR | Status: DC | PRN

## 2018-10-28 MED ORDER — HYDROMORPHONE HCL 1 MG/ML IJ SOLN
INTRAMUSCULAR | Status: AC
  Filled 2018-10-28: qty 2

## 2018-10-28 MED ORDER — MIDAZOLAM HCL 5 MG/5ML IJ SOLN
INTRAMUSCULAR | Status: DC | PRN
  Administered 2018-10-28 (×2): 1 mg via INTRAVENOUS

## 2018-10-28 MED ORDER — FERROUS SULFATE 325 (65 FE) MG PO TABS: 325.0000 mg | ORAL_TABLET | Freq: Three times a day (TID) | ORAL | 3 refills | Status: DC

## 2018-10-28 MED ORDER — HYDROMORPHONE HCL 1 MG/ML IJ SOLN
0.5000 mg | INTRAMUSCULAR | Status: DC | PRN
  Administered 2018-10-28: 1 mg via INTRAVENOUS
  Filled 2018-10-28: qty 1

## 2018-10-28 MED ORDER — TRANEXAMIC ACID-NACL 1000-0.7 MG/100ML-% IV SOLN
1000.0000 mg | INTRAVENOUS | Status: AC
  Administered 2018-10-28: 1000 mg via INTRAVENOUS
  Filled 2018-10-28: qty 100

## 2018-10-28 MED ORDER — CELECOXIB 200 MG PO CAPS
200.0000 mg | ORAL_CAPSULE | Freq: Two times a day (BID) | ORAL | Status: DC
  Administered 2018-10-28 – 2018-10-29 (×2): 200 mg via ORAL
  Filled 2018-10-28 (×2): qty 1

## 2018-10-28 MED ORDER — DOCUSATE SODIUM 100 MG PO CAPS: 100.0000 mg | ORAL_CAPSULE | Freq: Two times a day (BID) | ORAL | 0 refills | Status: DC

## 2018-10-28 MED ORDER — SODIUM CHLORIDE 0.9 % IV SOLN
INTRAVENOUS | Status: DC
  Administered 2018-10-28 (×2): via INTRAVENOUS

## 2018-10-28 MED ORDER — ALUM & MAG HYDROXIDE-SIMETH 200-200-20 MG/5ML PO SUSP: 15.0000 mL | ORAL | Status: DC | PRN

## 2018-10-28 MED ORDER — PHENYLEPHRINE 40 MCG/ML (10ML) SYRINGE FOR IV PUSH (FOR BLOOD PRESSURE SUPPORT)
PREFILLED_SYRINGE | INTRAVENOUS | Status: AC
  Filled 2018-10-28: qty 10

## 2018-10-28 MED ORDER — CHLORHEXIDINE GLUCONATE 4 % EX LIQD: 60.0000 mL | Freq: Once | CUTANEOUS | Status: DC

## 2018-10-28 MED ORDER — METHOCARBAMOL 500 MG PO TABS: 500.0000 mg | ORAL_TABLET | Freq: Four times a day (QID) | ORAL | 0 refills | Status: DC | PRN

## 2018-10-28 MED ORDER — PROPOFOL 10 MG/ML IV BOLUS
INTRAVENOUS | Status: AC
  Filled 2018-10-28: qty 20

## 2018-10-28 MED ORDER — EPHEDRINE SULFATE-NACL 50-0.9 MG/10ML-% IV SOSY
PREFILLED_SYRINGE | INTRAVENOUS | Status: DC | PRN
  Administered 2018-10-28: 5 mg via INTRAVENOUS

## 2018-10-28 MED ORDER — DEXAMETHASONE SODIUM PHOSPHATE 10 MG/ML IJ SOLN
INTRAMUSCULAR | Status: AC
  Filled 2018-10-28: qty 1

## 2018-10-28 MED ORDER — DIPHENHYDRAMINE HCL 12.5 MG/5ML PO ELIX: 12.5000 mg | ORAL_SOLUTION | ORAL | Status: DC | PRN

## 2018-10-28 MED ORDER — ASPIRIN 81 MG PO CHEW: 81.0000 mg | CHEWABLE_TABLET | Freq: Two times a day (BID) | ORAL | 0 refills | Status: AC

## 2018-10-28 MED ORDER — TRANEXAMIC ACID-NACL 1000-0.7 MG/100ML-% IV SOLN
1000.0000 mg | Freq: Once | INTRAVENOUS | Status: AC
  Administered 2018-10-28: 1000 mg via INTRAVENOUS
  Filled 2018-10-28: qty 100

## 2018-10-28 MED ORDER — METOCLOPRAMIDE HCL 5 MG/ML IJ SOLN: 5.0000 mg | Freq: Three times a day (TID) | INTRAMUSCULAR | Status: DC | PRN

## 2018-10-28 MED ORDER — FENTANYL CITRATE (PF) 100 MCG/2ML IJ SOLN
INTRAMUSCULAR | Status: AC
  Filled 2018-10-28: qty 2

## 2018-10-28 MED ORDER — EPHEDRINE 5 MG/ML INJ
INTRAVENOUS | Status: AC
  Filled 2018-10-28: qty 10

## 2018-10-28 MED ORDER — ACETAMINOPHEN 325 MG PO TABS: 325.0000 mg | ORAL_TABLET | Freq: Four times a day (QID) | ORAL | Status: DC | PRN

## 2018-10-28 MED ORDER — FERROUS SULFATE 325 (65 FE) MG PO TABS
325.0000 mg | ORAL_TABLET | Freq: Three times a day (TID) | ORAL | Status: DC
  Administered 2018-10-29 (×2): 325 mg via ORAL
  Filled 2018-10-28 (×2): qty 1

## 2018-10-28 MED ORDER — CYCLOSPORINE 0.05 % OP EMUL
1.0000 [drp] | Freq: Two times a day (BID) | OPHTHALMIC | Status: DC
  Administered 2018-10-28 – 2018-10-29 (×2): 1 [drp] via OPHTHALMIC
  Filled 2018-10-28 (×2): qty 1

## 2018-10-28 MED ORDER — PHENYLEPHRINE 40 MCG/ML (10ML) SYRINGE FOR IV PUSH (FOR BLOOD PRESSURE SUPPORT)
PREFILLED_SYRINGE | INTRAVENOUS | Status: DC | PRN
  Administered 2018-10-28 (×2): 80 ug via INTRAVENOUS
  Administered 2018-10-28: 40 ug via INTRAVENOUS
  Administered 2018-10-28 (×6): 80 ug via INTRAVENOUS
  Administered 2018-10-28: 40 ug via INTRAVENOUS

## 2018-10-28 MED ORDER — HYDROCHLOROTHIAZIDE 25 MG PO TABS
12.5000 mg | ORAL_TABLET | Freq: Every day | ORAL | Status: DC
  Administered 2018-10-29: 12.5 mg via ORAL
  Filled 2018-10-28: qty 1

## 2018-10-28 MED ORDER — STERILE WATER FOR IRRIGATION IR SOLN
Status: DC | PRN
  Administered 2018-10-28: 2000 mL

## 2018-10-28 MED ORDER — METHOCARBAMOL 500 MG IVPB - SIMPLE MED
INTRAVENOUS | Status: AC
  Filled 2018-10-28: qty 50

## 2018-10-28 MED ORDER — HYDROMORPHONE HCL 1 MG/ML IJ SOLN
0.2500 mg | INTRAMUSCULAR | Status: DC | PRN
  Administered 2018-10-28 (×4): 0.5 mg via INTRAVENOUS

## 2018-10-28 MED ORDER — METHOCARBAMOL 500 MG PO TABS
500.0000 mg | ORAL_TABLET | Freq: Four times a day (QID) | ORAL | Status: DC | PRN
  Administered 2018-10-28 – 2018-10-29 (×2): 500 mg via ORAL
  Filled 2018-10-28 (×2): qty 1

## 2018-10-28 MED ORDER — DEXAMETHASONE SODIUM PHOSPHATE 10 MG/ML IJ SOLN
10.0000 mg | Freq: Once | INTRAMUSCULAR | Status: AC
  Administered 2018-10-28: 10 mg via INTRAVENOUS

## 2018-10-28 MED ORDER — HYDROCODONE-ACETAMINOPHEN 5-325 MG PO TABS: 1.0000 | ORAL_TABLET | ORAL | Status: DC | PRN

## 2018-10-28 MED ORDER — METOCLOPRAMIDE HCL 5 MG PO TABS: 5.0000 mg | ORAL_TABLET | Freq: Three times a day (TID) | ORAL | Status: DC | PRN

## 2018-10-28 MED ORDER — OMEPRAZOLE 20 MG PO CPDR
20.0000 mg | DELAYED_RELEASE_CAPSULE | Freq: Every day | ORAL | Status: DC
  Administered 2018-10-29: 20 mg via ORAL
  Filled 2018-10-28 (×2): qty 1

## 2018-10-28 MED ORDER — HYDROCODONE-ACETAMINOPHEN 7.5-325 MG PO TABS
1.0000 | ORAL_TABLET | ORAL | Status: DC | PRN
  Administered 2018-10-28 (×3): 1 via ORAL
  Administered 2018-10-29 (×3): 2 via ORAL
  Filled 2018-10-28 (×2): qty 2
  Filled 2018-10-28: qty 1
  Filled 2018-10-28: qty 2
  Filled 2018-10-28 (×2): qty 1

## 2018-10-28 MED ORDER — ONDANSETRON HCL 4 MG/2ML IJ SOLN
INTRAMUSCULAR | Status: AC
  Filled 2018-10-28: qty 2

## 2018-10-28 MED ORDER — ASPIRIN 81 MG PO CHEW
81.0000 mg | CHEWABLE_TABLET | Freq: Two times a day (BID) | ORAL | Status: DC
  Administered 2018-10-28 – 2018-10-29 (×2): 81 mg via ORAL
  Filled 2018-10-28 (×2): qty 1

## 2018-10-28 MED ORDER — PROPOFOL 10 MG/ML IV BOLUS
INTRAVENOUS | Status: AC
  Filled 2018-10-28: qty 40

## 2018-10-28 MED ORDER — PHENOL 1.4 % MT LIQD
1.0000 | OROMUCOSAL | Status: DC | PRN
  Filled 2018-10-28: qty 177

## 2018-10-28 MED ORDER — CEFAZOLIN SODIUM-DEXTROSE 2-4 GM/100ML-% IV SOLN
2.0000 g | Freq: Four times a day (QID) | INTRAVENOUS | Status: AC
  Administered 2018-10-28 (×2): 2 g via INTRAVENOUS
  Filled 2018-10-28 (×2): qty 100

## 2018-10-28 MED ORDER — DOCUSATE SODIUM 100 MG PO CAPS
100.0000 mg | ORAL_CAPSULE | Freq: Two times a day (BID) | ORAL | Status: DC
  Administered 2018-10-28 – 2018-10-29 (×2): 100 mg via ORAL
  Filled 2018-10-28 (×2): qty 1

## 2018-10-28 MED ORDER — FENTANYL CITRATE (PF) 100 MCG/2ML IJ SOLN
INTRAMUSCULAR | Status: DC | PRN
  Administered 2018-10-28 (×2): 25 ug via INTRAVENOUS

## 2018-10-28 MED ORDER — LACTATED RINGERS IV SOLN
INTRAVENOUS | Status: DC
  Administered 2018-10-28 (×2): via INTRAVENOUS

## 2018-10-28 MED ORDER — SODIUM CHLORIDE 0.9 % IR SOLN
Status: DC | PRN
  Administered 2018-10-28: 1000 mL

## 2018-10-28 MED ORDER — POLYETHYLENE GLYCOL 3350 17 G PO PACK
17.0000 g | PACK | Freq: Two times a day (BID) | ORAL | Status: DC
  Administered 2018-10-29: 17 g via ORAL
  Filled 2018-10-28 (×2): qty 1

## 2018-10-28 MED ORDER — METHOCARBAMOL 500 MG IVPB - SIMPLE MED
500.0000 mg | Freq: Four times a day (QID) | INTRAVENOUS | Status: DC | PRN
  Administered 2018-10-28: 500 mg via INTRAVENOUS
  Filled 2018-10-28: qty 50

## 2018-10-28 MED ORDER — BUPIVACAINE IN DEXTROSE 0.75-8.25 % IT SOLN
INTRATHECAL | Status: DC | PRN
  Administered 2018-10-28: 2 mL via INTRATHECAL

## 2018-10-28 MED ORDER — ONDANSETRON HCL 4 MG/2ML IJ SOLN: 4.0000 mg | Freq: Four times a day (QID) | INTRAMUSCULAR | Status: DC | PRN

## 2018-10-28 MED ORDER — MIDAZOLAM HCL 2 MG/2ML IJ SOLN
INTRAMUSCULAR | Status: AC
  Filled 2018-10-28: qty 2

## 2018-10-28 MED ORDER — MAGNESIUM CITRATE PO SOLN: 1.0000 | Freq: Once | ORAL | Status: DC | PRN

## 2018-10-28 MED ORDER — MENTHOL 3 MG MT LOZG: 1.0000 | LOZENGE | OROMUCOSAL | Status: DC | PRN

## 2018-10-28 MED ORDER — PROPOFOL 500 MG/50ML IV EMUL
INTRAVENOUS | Status: DC | PRN
  Administered 2018-10-28: 50 ug/kg/min via INTRAVENOUS

## 2018-10-28 MED ORDER — BISACODYL 10 MG RE SUPP: 10.0000 mg | Freq: Every day | RECTAL | Status: DC | PRN

## 2018-10-28 MED ORDER — LIDOCAINE 2% (20 MG/ML) 5 ML SYRINGE
INTRAMUSCULAR | Status: AC
  Filled 2018-10-28: qty 5

## 2018-10-28 MED ORDER — CEFAZOLIN SODIUM-DEXTROSE 2-4 GM/100ML-% IV SOLN
2.0000 g | INTRAVENOUS | Status: AC
  Administered 2018-10-28: 2 g via INTRAVENOUS
  Filled 2018-10-28: qty 100

## 2018-10-28 MED ORDER — DEXAMETHASONE SODIUM PHOSPHATE 10 MG/ML IJ SOLN
10.0000 mg | Freq: Once | INTRAMUSCULAR | Status: AC
  Administered 2018-10-29: 10 mg via INTRAVENOUS
  Filled 2018-10-28: qty 1

## 2018-10-28 MED ORDER — LIDOCAINE 2% (20 MG/ML) 5 ML SYRINGE
INTRAMUSCULAR | Status: DC | PRN
  Administered 2018-10-28: 60 mg via INTRAVENOUS
  Administered 2018-10-28: 40 mg via INTRAVENOUS

## 2018-10-28 SURGICAL SUPPLY — 38 items
BAG ZIPLOCK 12X15 (MISCELLANEOUS) ×3 IMPLANT
BLADE SAG 18X100X1.27 (BLADE) ×3 IMPLANT
COVER PERINEAL POST (MISCELLANEOUS) ×3 IMPLANT
COVER SURGICAL LIGHT HANDLE (MISCELLANEOUS) ×3 IMPLANT
COVER WAND RF STERILE (DRAPES) IMPLANT
CUP ACETBLR 52 OD PINNACLE (Hips) ×3 IMPLANT
DERMABOND ADVANCED (GAUZE/BANDAGES/DRESSINGS) ×2
DERMABOND ADVANCED .7 DNX12 (GAUZE/BANDAGES/DRESSINGS) ×1 IMPLANT
DRAPE STERI IOBAN 125X83 (DRAPES) ×3 IMPLANT
DRAPE U-SHAPE 47X51 STRL (DRAPES) ×6 IMPLANT
DRESSING AQUACEL AG SP 3.5X10 (GAUZE/BANDAGES/DRESSINGS) ×1 IMPLANT
DRSG AQUACEL AG SP 3.5X10 (GAUZE/BANDAGES/DRESSINGS) ×3
DURAPREP 26ML APPLICATOR (WOUND CARE) ×3 IMPLANT
ELECT REM PT RETURN 15FT ADLT (MISCELLANEOUS) ×3 IMPLANT
ELIMINATOR HOLE APEX DEPUY (Hips) ×3 IMPLANT
GLOVE BIOGEL M STRL SZ7.5 (GLOVE) ×6 IMPLANT
GLOVE BIOGEL PI IND STRL 6.5 (GLOVE) ×1 IMPLANT
GLOVE BIOGEL PI IND STRL 7.5 (GLOVE) ×3 IMPLANT
GLOVE BIOGEL PI INDICATOR 6.5 (GLOVE) ×2
GLOVE BIOGEL PI INDICATOR 7.5 (GLOVE) ×6
GLOVE ORTHO TXT STRL SZ7.5 (GLOVE) ×3 IMPLANT
GLOVE SURG SS PI 6.5 STRL IVOR (GLOVE) ×3 IMPLANT
GOWN STRL REUS W/TWL LRG LVL3 (GOWN DISPOSABLE) ×6 IMPLANT
GOWN STRL REUS W/TWL XL LVL3 (GOWN DISPOSABLE) ×6 IMPLANT
HEAD CERAMIC DELTA 36 PLUS 1.5 (Hips) ×3 IMPLANT
HOLDER FOLEY CATH W/STRAP (MISCELLANEOUS) ×3 IMPLANT
LINER NEUTRAL 52X36MM PLUS 4 (Liner) ×3 IMPLANT
PACK ANTERIOR HIP CUSTOM (KITS) ×3 IMPLANT
SCREW 6.5MMX25MM (Screw) ×3 IMPLANT
STEM FEMORAL SZ6 HIGH ACTIS (Stem) ×3 IMPLANT
SUT MNCRL AB 4-0 PS2 18 (SUTURE) ×3 IMPLANT
SUT STRATAFIX 0 PDS 27 VIOLET (SUTURE) ×3
SUT VIC AB 1 CT1 36 (SUTURE) ×9 IMPLANT
SUT VIC AB 2-0 CT1 27 (SUTURE) ×4
SUT VIC AB 2-0 CT1 TAPERPNT 27 (SUTURE) ×2 IMPLANT
SUTURE STRATFX 0 PDS 27 VIOLET (SUTURE) ×1 IMPLANT
TRAY FOLEY MTR SLVR 14FR STAT (SET/KITS/TRAYS/PACK) ×3 IMPLANT
YANKAUER SUCT BULB TIP 10FT TU (MISCELLANEOUS) ×3 IMPLANT

## 2018-10-28 NOTE — Anesthesia Postprocedure Evaluation (Signed)
Anesthesia Post Note  Patient: Joanne SorrowMary H Howard  Procedure(s) Performed: RIGHT TOTAL HIP ARTHROPLASTY ANTERIOR APPROACH (Right Hip)     Patient location during evaluation: PACU Anesthesia Type: Spinal Level of consciousness: oriented and awake and alert Pain management: pain level controlled Vital Signs Assessment: post-procedure vital signs reviewed and stable Respiratory status: spontaneous breathing, respiratory function stable and patient connected to nasal cannula oxygen Cardiovascular status: blood pressure returned to baseline and stable Postop Assessment: no headache, no backache and no apparent nausea or vomiting Anesthetic complications: no    Last Vitals:  Vitals:   10/28/18 1330 10/28/18 1430  BP: 136/84 (!) 143/74  Pulse: 66 68  Resp: 16 16  Temp: 36.5 C 36.5 C  SpO2: 96% 99%    Last Pain:  Vitals:   10/28/18 1400  TempSrc:   PainSc: 4                  [name] S

## 2018-10-28 NOTE — Plan of Care (Signed)
  Problem: Health Behavior/Discharge Planning: Goal: Ability to manage health-related needs will improve Outcome: Progressing   Problem: Clinical Measurements: Goal: Ability to maintain clinical measurements within normal limits will improve Outcome: Progressing Goal: Will remain free from infection Outcome: Progressing Goal: Respiratory complications will improve Outcome: Progressing   Problem: Activity: Goal: Risk for activity intolerance will decrease Outcome: Progressing   Problem: Nutrition: Goal: Adequate nutrition will be maintained Outcome: Progressing   Problem: Coping: Goal: Level of anxiety will decrease Outcome: Progressing   Problem: Pain Managment: Goal: General experience of comfort will improve Outcome: Progressing   Problem: Safety: Goal: Ability to remain free from injury will improve Outcome: Progressing   Problem: Skin Integrity: Goal: Risk for impaired skin integrity will decrease Outcome: Progressing   Problem: Education: Goal: Knowledge of the prescribed therapeutic regimen will improve Outcome: Progressing Goal: Understanding of discharge needs will improve Outcome: Progressing Goal: Individualized Educational Video(s) Outcome: Progressing   Problem: Activity: Goal: Ability to tolerate increased activity will improve Outcome: Progressing   Problem: Clinical Measurements: Goal: Postoperative complications will be avoided or minimized Outcome: Progressing   Problem: Pain Management: Goal: Pain level will decrease with appropriate interventions Outcome: Progressing   Problem: Skin Integrity: Goal: Will show signs of wound healing Outcome: Progressing

## 2018-10-28 NOTE — Discharge Instructions (Signed)

## 2018-10-28 NOTE — Anesthesia Procedure Notes (Signed)
Spinal  Patient location during procedure: OR Start time: 10/28/2018 8:32 AM End time: 10/28/2018 8:36 AM Staffing Anesthesiologist: Eilene Ghaziose, George, MD Resident/CRNA: Marny Lowensteinapozzi, Quinn Quam W, CRNA Performed: resident/CRNA  Preanesthetic Checklist Completed: patient identified, site marked, surgical consent, pre-op evaluation, timeout performed, IV checked, risks and benefits discussed and monitors and equipment checked Spinal Block Patient position: sitting Prep: DuraPrep Patient monitoring: heart rate, continuous pulse ox and blood pressure Approach: midline Location: L3-4 Injection technique: single-shot Needle Needle type: Pencan  Needle length: 10 cm Assessment Sensory level: T8

## 2018-10-28 NOTE — Op Note (Signed)
NAME:  [name]                ACCOUNT NO.: [id number]      MEDICAL RECORD NO.: [id number]      [facility]      PHYSICIAN:  Shelda PalMatthew D Skylier Kretschmer  DATE OF BIRTH:  1951/07/22     DATE OF PROCEDURE:  10/28/2018                                 OPERATIVE REPORT         PREOPERATIVE DIAGNOSIS: Right  hip osteoarthritis.      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      SURGEON:  Madlyn FrankelMatthew D. Charlann Boxerlin, M.D.      ASSISTANT:  Skip MayerBlair Roberts, PA-C     ANESTHESIA:  Spinal.      SPECIMENS:  None.      COMPLICATIONS:  None.      BLOOD LOSS:  300 cc     DRAINS:  None.      INDICATION OF THE PROCEDURE:  [name] is a 67 y.o. female who had   presented to office for evaluation of right hip pain.  Radiographs revealed   progressive degenerative changes with bone-on-bone   articulation of the  hip joint, including subchondral cystic changes and osteophytes.  The patient had painful limited range of   motion significantly affecting their overall quality of life and function.  The patient was failing to    respond to conservative measures including medications and/or injections and activity modification and at this point was ready   to proceed with more definitive measures.  Consent was obtained for   benefit of pain relief.  Specific risks of infection, DVT, component   failure, dislocation, neurovascular injury, and need for revision surgery were reviewed in the office as well discussion of   the anterior versus posterior approach were reviewed.     PROCEDURE IN DETAIL:  The patient was brought to operative theater.   Once adequate anesthesia, preoperative antibiotics, 2 gm of Ancef, 1 gm of  Tranexamic Acid, and 10 mg of Decadron were administered, the patient was positioned supine on the [employer] Hanna table.  Once the patient was safely positioned with adequate padding of boney prominences we predraped out the hip, and used fluoroscopy to confirm orientation of the pelvis.      The right hip was then prepped and draped from proximal iliac crest to   mid thigh with a shower curtain technique.      Time-out was performed identifying the patient, planned procedure, and the appropriate extremity.     An incision was then made 2 cm lateral to the   anterior superior iliac spine extending over the orientation of the   tensor fascia lata muscle and sharp dissection was carried down to the   fascia of the muscle.      The fascia was then incised.  The muscle belly was identified and swept   laterally and retractor placed along the superior neck.  Following   cauterization of the circumflex vessels and removing  some pericapsular   fat, a second cobra retractor was placed on the inferior neck.  A T-capsulotomy was made along the line of the   superior neck to the trochanteric fossa, then extended proximally and   distally.  Tag sutures were placed and the retractors were then placed   intracapsular.  We then identified the trochanteric fossa and   orientation of my neck cut and then made a neck osteotomy with the femur on traction.  The femoral   head was removed without difficulty or complication.  Traction was let   off and retractors were placed posterior and anterior around the   acetabulum.      The labrum and foveal tissue were debrided.  I began reaming with a 44 mm   reamer and reamed up to 51 mm reamer with good bony bed preparation and a 52 mm  cup was chosen.  The final 52 mm Pinnacle cup was then impacted under fluoroscopy to confirm the depth of penetration and orientation with respect to   Abduction and forward flexion.  A screw was placed into the ilium followed by the hole  eliminator.  The final   36+4 neutral Altrex liner was impacted with good visualized rim fit.  The cup was positioned anatomically within the acetabular portion of the pelvis.      At this point, the femur was rolled to 100 degrees.  Further capsule was   released off the inferior aspect of the femoral neck.  I then   released the superior capsule proximally.  With the leg in a neutral position the hook was placed laterally   along the femur under the vastus lateralis origin and elevated manually and then held in position using the hook attachment on the bed.  The leg was then extended and adducted with the leg rolled to 100   degrees of external rotation.  Retractors were placed along the medial calcar and posteriorly over the greater trochanter.  Once the proximal femur was fully   exposed, I used a box osteotome to set orientation.  I then began   broaching with the starting chili pepper broach and passed this by hand and then broached up to 6.  With the 6 broach in place I chose a high offset neck and did several trial reductions.  The offset was appropriate, leg lengths   appeared to be equal best matched with the +1.5 head ball trial confirmed radiographically.   Given these findings, I went ahead and dislocated the hip, repositioned all   retractors and positioned the right hip in the extended and abducted position.  The final 6 Hi Actis stem was   chosen and it was impacted down to the level of neck cut.  Based on this   and the trial reductions, a final 36+1.5 delta ceramic ball was chosen and   impacted onto a clean and dry trunnion, and the hip was reduced.  The   hip had been irrigated throughout the case again at this point.  I did   reapproximate the superior capsular leaflet to the anterior leaflet   using #1 Vicryl.  The fascia of the   tensor fascia lata muscle was then reapproximated using #1 Vicryl and #0 Stratafix sutures.  The   remaining wound was closed with 2-0 Vicryl and  running 4-0 Monocryl.   The hip was cleaned, dried, and dressed sterilely using Dermabond and   Aquacel dressing.  The patient was then brought  to recovery room in stable condition tolerating the procedure well.    Skip Mayer, PA-C was present for the entirety of the case involved from   preoperative positioning, perioperative retractor management, general   facilitation of the case, as well as primary wound closure as assistant.            Madlyn Frankel Charlann Boxer, M.D.        10/28/2018 9:52 AM

## 2018-10-28 NOTE — Anesthesia Preprocedure Evaluation (Signed)
Anesthesia Evaluation  Patient identified by MRN, date of birth, ID band Patient awake    Reviewed: Allergy & Precautions, NPO status , Patient's Chart, lab work & pertinent test results  Airway Mallampati: II  TM Distance: >3 FB Neck ROM: Full    Dental no notable dental hx.    Pulmonary neg pulmonary ROS, former smoker,    Pulmonary exam normal breath sounds clear to auscultation       Cardiovascular hypertension, + Peripheral Vascular Disease  Normal cardiovascular exam Rhythm:Regular Rate:Normal     Neuro/Psych negative neurological ROS  negative psych ROS   GI/Hepatic negative GI ROS, Neg liver ROS,   Endo/Other  negative endocrine ROS  Renal/GU negative Renal ROS  negative genitourinary   Musculoskeletal  (+) Arthritis , Osteoarthritis,    Abdominal   Peds negative pediatric ROS (+)  Hematology negative hematology ROS (+)   Anesthesia Other Findings   Reproductive/Obstetrics negative OB ROS                             Anesthesia Physical Anesthesia Plan  ASA: III  Anesthesia Plan: Spinal   Post-op Pain Management:    Induction: Intravenous  PONV Risk Score and Plan: 2 and Ondansetron, Dexamethasone and Treatment may vary due to age or medical condition  Airway Management Planned: Simple Face Mask  Additional Equipment:   Intra-op Plan:   Post-operative Plan:   Informed Consent: I have reviewed the patients History and Physical, chart, labs and discussed the procedure including the risks, benefits and alternatives for the proposed anesthesia with the patient or authorized representative who has indicated his/her understanding and acceptance.   Dental advisory given  Plan Discussed with: CRNA and Surgeon  Anesthesia Plan Comments:         Anesthesia Quick Evaluation

## 2018-10-28 NOTE — Transfer of Care (Signed)
Immediate Anesthesia Transfer of Care Note  Patient: [name]  Procedure(s) Performed: RIGHT TOTAL HIP ARTHROPLASTY ANTERIOR APPROACH (Right Hip)  Patient Location: PACU  Anesthesia Type:MAC and Spinal  Level of Consciousness: awake, alert  and oriented  Airway & Oxygen Therapy: Patient Spontanous Breathing  Post-op Assessment: Report given to RN and Post -op Vital signs reviewed and stable  Post vital signs: Reviewed and stable  Last Vitals:  Vitals Value Taken Time  BP    Temp    Pulse    Resp    SpO2      Last Pain:  Vitals:   10/28/18 2863  TempSrc: Oral         Complications: No apparent anesthesia complications

## 2018-10-28 NOTE — Evaluation (Signed)
Physical Therapy Evaluation Patient Details Name: Joanne SorrowMary H Howard MRN: [id number] DOB: 02-25-1951 Today's Date: 10/28/2018   History of Present Illness  s/p R THA  Clinical Impression  Pt is s/p THA resulting in the deficits listed below (see PT Problem List).  Pt will benefit from skilled PT to increase their independence and safety with mobility to allow discharge to the venue listed below. Will see in am, some issues with pain today limiting mobility; pt would like to be able to get upstairs to her bedroom at d/c     Follow Up Recommendations Follow surgeon's recommendation for DC plan and follow-up therapies    Equipment Recommendations  None recommended by PT    Recommendations for Other Services       Precautions / Restrictions Precautions Precautions: Fall Restrictions Weight Bearing Restrictions: No Other Position/Activity Restrictions: WBAT      Mobility  Bed Mobility Overal bed mobility: Needs Assistance Bed Mobility: Supine to Sit     Supine to sit: Mod assist     General bed mobility comments: assist with RLE and trunk, incr time and assist d/t pain  Transfers Overall transfer level: Needs assistance Equipment used: Rolling walker (2 wheeled) Transfers: Stand Pivot Transfers Sit to Stand: Min assist;From elevated surface Stand pivot transfers: Min assist       General transfer comment: cues for hand placement and RLE position; cues for sequencing for pivot  Ambulation/Gait             General Gait Details: deferred d/t pain  Stairs            Wheelchair Mobility    Modified Rankin (Stroke Patients Only)       Balance                                             Pertinent Vitals/Pain Pain Assessment: 0-10 Pain Location: right hip Pain Descriptors / Indicators: Guarding;Grimacing;Sore Pain Intervention(s): Limited activity within patient's tolerance;Monitored during session;Premedicated before session;Other  (comment)(had all possible  meds)    Home Living Family/patient expects to be discharged to:: Private residence Living Arrangements: Spouse/significant other Available Help at Discharge: Family;Available 24 hours/day Type of Home: House Home Access: Stairs to enter   [employer] Corporation[employer]trance Stairs-Number of Steps: 3 Home Layout: Two level;1/2 bath on main level Home Equipment: Cane - single point;Walker - 2 wheels Additional Comments: amb with cane     Prior Function Level of Independence: Independent               Hand Dominance        Extremity/Trunk Assessment   Upper Extremity Assessment Upper Extremity Assessment: Overall WFL for tasks assessed    Lower Extremity Assessment Lower Extremity Assessment: RLE deficits/detail RLE Deficits / Details: ankle WFL RLE: Unable to fully assess due to pain       Communication   Communication: No difficulties  Cognition Arousal/Alertness: Awake/alert Behavior During Therapy: WFL for tasks assessed/performed Overall Cognitive Status: Within Functional Limits for tasks assessed                                        General Comments      Exercises Total Joint Exercises Ankle Circles/Pumps: AROM;Both;10 reps Quad Sets: AROM;5 reps;Both   Assessment/Plan    PT  Assessment Patient needs continued PT services  PT Problem List Decreased strength;Decreased activity tolerance;Decreased mobility;Pain;Decreased knowledge of use of DME;Decreased range of motion       PT Treatment Interventions Gait training;DME instruction;Therapeutic activities;Therapeutic exercise;Patient/family education;Stair training;Functional mobility training    PT Goals (Current goals can be found in the Care Plan section)  Acute Rehab PT Goals PT Goal Formulation: With patient Time For Goal Achievement: 11/04/18 Potential to Achieve Goals: Good    Frequency 7X/week   Barriers to discharge        Co-evaluation                AM-PAC PT "6 Clicks" Mobility  Outcome Measure Help needed turning from your back to your side while in a flat bed without using bedrails?: A Lot Help needed moving from lying on your back to sitting on the side of a flat bed without using bedrails?: A Lot Help needed moving to and from a bed to a chair (including a wheelchair)?: A Little Help needed standing up from a chair using your arms (e.g., wheelchair or bedside chair)?: A Little Help needed to walk in hospital room?: A Lot Help needed climbing 3-5 steps with a railing? : A Lot 6 Click Score: 14    End of Session Equipment Utilized During Treatment: Gait belt Activity Tolerance: Patient tolerated treatment well Patient left: in chair;with call bell/phone within reach;with chair alarm set;with family/visitor present   PT Visit Diagnosis: Difficulty in walking, not elsewhere classified (R26.2)    Time: 1610-9604 PT Time Calculation (min) (ACUTE ONLY): 28 min   Charges:   PT Evaluation $PT Eval Low Complexity: 1 Low PT Treatments $Therapeutic Activity: 8-22 mins        Drucilla Chalet, PT  Pager: [phone] Acute Rehab Dept [facility]): [phone]   10/28/2018   [facility] 10/28/2018, 4:33 PM

## 2018-10-28 NOTE — Interval H&P Note (Signed)
History and Physical Interval Note:  10/28/2018 6:55 AM  [name]  has presented today for surgery, with the diagnosis of Right hip osteoarthritis  The various methods of treatment have been discussed with the patient and family. After consideration of risks, benefits and other options for treatment, the patient has consented to  Procedure(s) with comments: RIGHT TOTAL HIP ARTHROPLASTY ANTERIOR APPROACH (Right) - 70 mins as a surgical intervention .  The patient's history has been reviewed, patient examined, no change in status, stable for surgery.  I have reviewed the patient's chart and labs.  Questions were answered to the patient's satisfaction.     Shelda PalMatthew D Kail Fraley

## 2018-10-29 ENCOUNTER — Encounter (HOSPITAL_COMMUNITY): Payer: Self-pay | Admitting: Orthopedic Surgery

## 2018-10-29 DIAGNOSIS — E663 Overweight: Secondary | ICD-10-CM | POA: Diagnosis present

## 2018-10-29 LAB — BASIC METABOLIC PANEL
ANION GAP: 8 (ref 5–15)
BUN: 13 mg/dL (ref 8–23)
CALCIUM: 8.2 mg/dL — AB (ref 8.9–10.3)
CO2: 22 mmol/L (ref 22–32)
CREATININE: 0.65 mg/dL (ref 0.44–1.00)
Chloride: 109 mmol/L (ref 98–111)
GLUCOSE: 140 mg/dL — AB (ref 70–99)
Potassium: 4 mmol/L (ref 3.5–5.1)
Sodium: 139 mmol/L (ref 135–145)

## 2018-10-29 LAB — CBC
HCT: 37.6 % (ref 36.0–46.0)
Hemoglobin: 11.6 g/dL — ABNORMAL LOW (ref 12.0–15.0)
MCH: 27.9 pg (ref 26.0–34.0)
MCHC: 30.9 g/dL (ref 30.0–36.0)
MCV: 90.4 fL (ref 80.0–100.0)
NRBC: 0 % (ref 0.0–0.2)
PLATELETS: 234 10*3/uL (ref 150–400)
RBC: 4.16 MIL/uL (ref 3.87–5.11)
RDW: 14.1 % (ref 11.5–15.5)
WBC: 16.6 10*3/uL — AB (ref 4.0–10.5)

## 2018-10-29 NOTE — Progress Notes (Signed)
Physical Therapy Treatment Patient Details Name: [name] MRN: [id number] DOB: January 07, 1951 Today's Date: 10/29/2018    History of Present Illness  R DA-THA    PT Comments    Pt progressing well with mobility, she ambulated 120' with RW, completed stair training, and demonstrates understanding of HEP. She is ready to DC home from PT standpoint.   Follow Up Recommendations  Follow surgeon's recommendation for DC plan and follow-up therapies     Equipment Recommendations  None recommended by PT    Recommendations for Other Services       Precautions / Restrictions Precautions Precautions: Fall Restrictions Weight Bearing Restrictions: No Other Position/Activity Restrictions: WBAT    Mobility  Bed Mobility               General bed mobility comments: up in recliner  Transfers Overall transfer level: Needs assistance Equipment used: Rolling walker (2 wheeled) Transfers: Sit to/from Stand Sit to Stand: Supervision         General transfer comment: cues for hand placement and RLE position  Ambulation/Gait Ambulation/Gait assistance: Supervision Gait Distance (Feet): 120 Feet Assistive device: Rolling walker (2 wheeled) Gait Pattern/deviations: Step-to pattern;Antalgic Gait velocity: decr   General Gait Details:  VCs sequencing, no loss of balance   Stairs Stairs: Yes Stairs assistance: Min guard Stair Management: One rail Right;Forwards;With cane;Step to pattern Number of Stairs: 5 General stair comments: practices 3 steps backwards with no rail, then 5 steps with R rail and cane forwards (pt has 3 STE, then flight up to bedroom), husband present, VCs sequencing   Wheelchair Mobility    Modified Rankin (Stroke Patients Only)       Balance Overall balance assessment: Modified Independent                                          Cognition Arousal/Alertness: Awake/alert Behavior During Therapy: WFL for tasks  assessed/performed Overall Cognitive Status: Within Functional Limits for tasks assessed                                        Exercises Total Joint Exercises Ankle Circles/Pumps: AROM;Both;10 reps Quad Sets: AROM;10 reps;Right Short Arc Quad: AROM;Right;10 reps;Supine Heel Slides: AAROM;Right;10 reps;Supine Hip ABduction/ADduction: AAROM;Right;10 reps;Supine    General Comments        Pertinent Vitals/Pain Pain Score: 3  Pain Location: right hip Pain Descriptors / Indicators: Sore Pain Intervention(s): Limited activity within patient's tolerance;Monitored during session;Premedicated before session;Ice applied    Home Living                      Prior Function            PT Goals (current goals can now be found in the care plan section) Acute Rehab PT Goals PT Goal Formulation: With patient Time For Goal Achievement: 11/04/18 Potential to Achieve Goals: Good Progress towards PT goals: Progressing toward goals    Frequency    7X/week      PT Plan Current plan remains appropriate    Co-evaluation              AM-PAC PT "6 Clicks" Mobility   Outcome Measure  Help needed turning from your back to your side while in a flat bed without using bedrails?: A  Little Help needed moving from lying on your back to sitting on the side of a flat bed without using bedrails?: A Little Help needed moving to and from a bed to a chair (including a wheelchair)?: None Help needed standing up from a chair using your arms (e.g., wheelchair or bedside chair)?: None Help needed to walk in hospital room?: None Help needed climbing 3-5 steps with a railing? : A Little 6 Click Score: 21    End of Session Equipment Utilized During Treatment: Gait belt Activity Tolerance: Patient tolerated treatment well Patient left: in chair;with call bell/phone within reach;with nursing/sitter in room Nurse Communication: Mobility status PT Visit Diagnosis:  Difficulty in walking, not elsewhere classified (R26.2)     Time: [id number] PT Time Calculation (min) (ACUTE ONLY): 38 min  Charges:  $Gait Training: 8-22 mins $Therapeutic Exercise: 8-22 mins $Therapeutic Activity: 8-22 mins                     Ralene BatheUhlenberg, Danese Dorsainvil Kistler PT 10/29/2018  Acute Rehabilitation Services Pager [phone] Office [phone]

## 2018-10-29 NOTE — Progress Notes (Signed)
Physical Therapy Treatment Patient Details Name: [name] MRN: [id number] DOB: 1951-07-26 Today's Date: 10/29/2018    History of Present Illness s/p R THA    PT Comments    Pt expressed anxiety regarding difficulty with ambulation, she was teary and tremulous at beginning of session. She ambulated 86' with RW and performed THA exercises with min assist. She was able to urinate. Verbal encouragement provided, encouraged relaxation breathing and listened to relaxing music during PT session. At end of session pt was no longer tremulous and stated she was no longer anxious. Will plan to do stair training this afternoon.    Follow Up Recommendations  Follow surgeon's recommendation for DC plan and follow-up therapies     Equipment Recommendations  None recommended by PT    Recommendations for Other Services       Precautions / Restrictions Precautions Precautions: Fall Restrictions Weight Bearing Restrictions: No Other Position/Activity Restrictions: WBAT    Mobility  Bed Mobility   Bed Mobility: Supine to Sit     Supine to sit: Min assist     General bed mobility comments: assist with RLE, incr time and assist d/t pain  Transfers Overall transfer level: Needs assistance Equipment used: Rolling walker (2 wheeled) Transfers: Sit to/from [employer] Sit to Stand: Min assist Stand pivot transfers: Min assist       General transfer comment: cues for hand placement and RLE position; cues for sequencing for pivot  Ambulation/Gait Ambulation/Gait assistance: Min guard Gait Distance (Feet): 45 Feet Assistive device: Rolling walker (2 wheeled) Gait Pattern/deviations: Step-to pattern;Antalgic Gait velocity: decr   General Gait Details: distance limited by pain, VCs sequencing, no loss of balance   Stairs             Wheelchair Mobility    Modified Rankin (Stroke Patients Only)       Balance Overall balance assessment: Modified  Independent                                          Cognition Arousal/Alertness: Awake/alert Behavior During Therapy: WFL for tasks assessed/performed Overall Cognitive Status: Within Functional Limits for tasks assessed                                        Exercises Total Joint Exercises Ankle Circles/Pumps: AROM;Both;10 reps Quad Sets: AROM;10 reps;Right Short Arc Quad: AROM;Right;10 reps;Supine Heel Slides: AAROM;Right;10 reps;Supine Hip ABduction/ADduction: AAROM;Right;10 reps;Supine    General Comments        Pertinent Vitals/Pain Pain Score: 6  Pain Location: right hip Pain Descriptors / Indicators: Guarding;Grimacing;Sore Pain Intervention(s): Limited activity within patient's tolerance;Monitored during session;Premedicated before session;Ice applied    Home Living                      Prior Function            PT Goals (current goals can now be found in the care plan section) Acute Rehab PT Goals PT Goal Formulation: With patient Time For Goal Achievement: 11/04/18 Potential to Achieve Goals: Good Progress towards PT goals: Progressing toward goals    Frequency    7X/week      PT Plan Current plan remains appropriate    Co-evaluation  AM-PAC PT "6 Clicks" Mobility   Outcome Measure  Help needed turning from your back to your side while in a flat bed without using bedrails?: A Little Help needed moving from lying on your back to sitting on the side of a flat bed without using bedrails?: A Little Help needed moving to and from a bed to a chair (including a wheelchair)?: A Little Help needed standing up from a chair using your arms (e.g., wheelchair or bedside chair)?: A Little Help needed to walk in hospital room?: A Little Help needed climbing 3-5 steps with a railing? : A Lot 6 Click Score: 17    End of Session Equipment Utilized During Treatment: Gait belt Activity Tolerance:  Patient tolerated treatment well Patient left: in chair;with call bell/phone within reach;with chair alarm set Nurse Communication: Mobility status PT Visit Diagnosis: Difficulty in walking, not elsewhere classified (R26.2)     Time: [id number] PT Time Calculation (min) (ACUTE ONLY): 31 min  Charges:  $Gait Training: 8-22 mins $Therapeutic Exercise: 8-22 mins                     Ralene BatheUhlenberg, Denisia Harpole Kistler PT 10/29/2018  Acute Rehabilitation Services Pager [phone] Office [phone]

## 2018-10-29 NOTE — Progress Notes (Signed)
     Subjective: 1 Day Post-Op Procedure(s) (LRB): RIGHT TOTAL HIP ARTHROPLASTY ANTERIOR APPROACH (Right)   Seen by Dr. Charlann Boxerlin. Patient reports pain as mild, c/o some pain.  Some anxiety, but doing well.  No events throughout the night.  Ready to be discharged home if she does well with PT and pain stays controlled.     Objective:   VITALS:   Vitals:   10/29/18 0130 10/29/18 0530  BP: 135/78 139/71  Pulse: 70 80  Resp: 19 20  Temp: 97.7 F (36.5 C) 97.8 F (36.6 C)  SpO2: 100% 100%    Dorsiflexion/Plantar flexion intact Incision: dressing C/D/I No cellulitis present Compartment soft  LABS Recent Labs    10/29/18 0339  HGB 11.6*  HCT 37.6  WBC 16.6*  PLT 234    Recent Labs    10/29/18 0339  NA 139  K 4.0  BUN 13  CREATININE 0.65  GLUCOSE 140*     Assessment/Plan: 1 Day Post-Op Procedure(s) (LRB): RIGHT TOTAL HIP ARTHROPLASTY ANTERIOR APPROACH (Right) Foley cath d/c'ed Advance diet Up with therapy D/C IV fluids Discharge home [facility] Follow up with OLIN,Kiwana Deblasi D in 2 weeks.  Contact information:  [facility]) [address], [address] [place] [id number] [phone]    Overweight (BMI 25-29.9) Estimated body mass index is 27.91 kg/m as calculated from the following:   Height as of this encounter: 5\' 9"  (1.753 m).   Weight as of this encounter: 85.7 kg. Patient also counseled that weight may inhibit the healing process Patient counseled that losing weight will help with future health issues      Anastasio AuerbachMatthew S. Ronnel Zuercher   PAC  10/29/2018, 8:34 AM

## 2018-10-29 NOTE — Progress Notes (Signed)
Patient discharged to home w/ husband. Given all belongings, instructions, prescriptions, equipment. Verbalized understanding of all instructions. Escorted to pov via w/c. 

## 2018-11-03 NOTE — Discharge Summary (Signed)
Physician Discharge Summary  Patient ID: [name] MRN: [id number] DOB/AGE: 68-Aug-1952 68 y.o.  Admit date: 10/28/2018 Discharge date: 10/29/2018   Procedures:  Procedure(s) (LRB): RIGHT TOTAL HIP ARTHROPLASTY ANTERIOR APPROACH (Right)  Attending Physician:  Dr. Durene Romans   Admission Diagnoses:   Right hip primary OA / pain  Discharge Diagnoses:  Principal Problem:   S/P right THA, AA Active Problems:   Overweight (BMI 25.0-29.9)  Past Medical History:  Diagnosis Date  . Arthritis   . Complication of anesthesia    patient report she had a saddle block during c section and was aware during surgery that was being " cut on"   . GERD (gastroesophageal reflux disease)   . Hypertension   . Thoracic ascending aortic aneurysm [facility])    stable ; see CT angio 10-16-18 epic     HPI:     [name], 67 y.o. female, has a history of pain and functional disability in the right hip(s) due to arthritis and patient has failed non-surgical conservative treatments for greater than 12 weeks to include NSAID's and/or analgesics, corticosteriod injections, use of assistive devices and activity modification.  Onset of symptoms was gradual starting >10 years ago with gradually worsening course since that time.The patient noted no past surgery on the right hip(s).  Patient currently rates pain in the right hip at 9 out of 10 with activity. Patient has night pain, worsening of pain with activity and weight bearing, trendelenberg gait, pain that interfers with activities of daily living and pain with passive range of motion. Patient has evidence of periarticular osteophytes and joint space narrowing by imaging studies. This condition presents safety issues increasing the risk of falls. There is no current active infection.  Risks, benefits and expectations were discussed with the patient.  Risks including but not limited to the risk of anesthesia, blood clots, nerve damage, blood vessel  damage, failure of the prosthesis, infection and up to and including death.  Patient understand the risks, benefits and expectations and wishes to proceed with surgery.   PCP: Joycelyn Rua, MD   Discharged Condition: good  Hospital Course:  Patient underwent the above stated procedure on 10/28/2018. Patient tolerated the procedure well and brought to the recovery room in good condition and subsequently to the floor.  POD #1 BP: 139/71 ; Pulse: 80 ; Temp: 97.8 F (36.6 C) ; Resp: 20 Patient reports pain as mild, c/o some pain.  Some anxiety, but doing well.  No events throughout the night.  Ready to be discharged home. Dorsiflexion/plantar flexion intact, incision: dressing C/D/I, no cellulitis present and compartment soft.   LABS  Basename    HGB     11.6  HCT     37.6    Discharge Exam: General appearance: alert, cooperative and no distress Extremities: Homans sign is negative, no sign of DVT, no edema, redness or tenderness in the calves or thighs and no ulcers, gangrene or trophic changes  Disposition:  Home with follow up in 2 weeks   Follow-up Information    Durene Romans, MD. Schedule an appointment as soon as possible for a visit in 2 weeks.   Specialty:  Orthopedic Surgery Contact information: [address] [place] [address] [phone]           Discharge Instructions    Call MD / Call 911   Complete by:  As directed    If you experience chest pain or shortness of breath, CALL 911  and be transported to the hospital emergency room.  If you develope a fever above 101 F, pus (white drainage) or increased drainage or redness at the wound, or calf pain, call your surgeon's office.   Change dressing   Complete by:  As directed    Maintain surgical dressing until follow up in the clinic. If the edges start to pull up, may reinforce with tape. If the dressing is no longer working, may remove and cover with gauze and tape, but must keep the area  dry and clean.  Call with any questions or concerns.   Constipation Prevention   Complete by:  As directed    Drink plenty of fluids.  Prune juice may be helpful.  You may use a stool softener, such as Colace (over the counter) 100 mg twice a day.  Use MiraLax (over the counter) for constipation as needed.   Diet - low sodium heart healthy   Complete by:  As directed    Discharge instructions   Complete by:  As directed    Maintain surgical dressing until follow up in the clinic. If the edges start to pull up, may reinforce with tape. If the dressing is no longer working, may remove and cover with gauze and tape, but must keep the area dry and clean.  [facility]. Call with any questions or concerns.   Increase activity slowly as tolerated   Complete by:  As directed    Weight bearing as tolerated with assist device (walker, cane, etc) as directed, use it as long as suggested by your surgeon or therapist, typically at least 4-6 weeks.   TED hose   Complete by:  As directed    Use stockings (TED hose) for 2 weeks on both leg(s).  You may remove them at night for sleeping.      Allergies as of 10/29/2018      Reactions   Dulera [mometasone Furo-formoterol Fum] Palpitations      Medication List    STOP taking these medications   aspirin 81 MG tablet Replaced by:  aspirin 81 MG chewable tablet   ibuprofen 200 MG tablet Commonly known as:  ADVIL,MOTRIN   PROAIR HFA 108 (90 Base) MCG/ACT inhaler Generic drug:  albuterol   traMADol 50 MG tablet Commonly known as:  ULTRAM     TAKE these medications   aspirin 81 MG chewable tablet Chew 1 tablet (81 mg total) by mouth 2 (two) times daily. Take for 4 weeks, then resume regular dose. Replaces:  aspirin 81 MG tablet   b complex vitamins tablet Take 1 tablet by mouth daily.   CALCIUM-MAGNESIUM PO Take 2 tablets by mouth daily.   cholecalciferol 1000 units tablet Commonly known as:  VITAMIN  D Take 1,000 Units by mouth daily.   docusate sodium 100 MG capsule Commonly known as:  COLACE Take 1 capsule (100 mg total) by mouth 2 (two) times daily.   ferrous sulfate 325 (65 FE) MG tablet Take 1 tablet (325 mg total) by mouth 3 (three) times daily with meals.   FIBER DIET Tabs Take 1 tablet by mouth daily.   glucosamine-chondroitin 500-400 MG tablet Take 1 tablet by mouth daily.   hydrochlorothiazide 25 MG tablet Commonly known as:  HYDRODIURIL Take 12.5 mg by mouth daily.   HYDROcodone-acetaminophen 7.5-325 MG tablet Commonly known as:  NORCO Take 1-2 tablets by mouth every 4 (four) hours as needed for moderate pain.   HYDROcodone-acetaminophen 7.5-325  MG tablet Commonly known as:  NORCO Take 1-2 tablets by mouth every 4 (four) hours as needed for moderate pain.   LECITHIN PO Take 1 tablet by mouth daily.   methocarbamol 500 MG tablet Commonly known as:  ROBAXIN Take 1 tablet (500 mg total) by mouth every 6 (six) hours as needed for muscle spasms.   multivitamin capsule Take 1 capsule by mouth daily.   omeprazole 20 MG capsule Commonly known as:  PRILOSEC Take 20 mg by mouth daily.   polyethylene glycol packet Commonly known as:  MIRALAX / GLYCOLAX Take 17 g by mouth 2 (two) times daily.   RESTASIS 0.05 % ophthalmic emulsion Generic drug:  cycloSPORINE Place 1 drop into both eyes 2 (two) times daily.   VITAMIN C PO Take 1 tablet by mouth daily.            Discharge Care Instructions  (From admission, onward)         Start     Ordered   10/29/18 0000  Change dressing    Comments:  Maintain surgical dressing until follow up in the clinic. If the edges start to pull up, may reinforce with tape. If the dressing is no longer working, may remove and cover with gauze and tape, but must keep the area dry and clean.  Call with any questions or concerns.   10/29/18 0845           Signed: Anastasio Auerbach. Evelin Cake   PA-C  11/03/2018, 7:58 AM

## 2018-11-04 ENCOUNTER — Ambulatory Visit: Payer: Medicare Other | Admitting: Cardiology

## 2019-02-16 ENCOUNTER — Other Ambulatory Visit: Payer: Self-pay | Admitting: Internal Medicine

## 2019-02-27 ENCOUNTER — Ambulatory Visit: Payer: Medicare Other | Admitting: Internal Medicine

## 2019-10-04 HISTORY — PX: TRANSTHORACIC ECHOCARDIOGRAM: SHX275

## 2019-10-04 HISTORY — PX: OTHER SURGICAL HISTORY: SHX169

## 2019-10-07 ENCOUNTER — Telehealth: Payer: Self-pay | Admitting: *Deleted

## 2019-10-07 DIAGNOSIS — I77810 Thoracic aortic ectasia: Secondary | ICD-10-CM

## 2019-10-07 DIAGNOSIS — Z01818 Encounter for other preprocedural examination: Secondary | ICD-10-CM

## 2019-10-07 DIAGNOSIS — I712 Thoracic aortic aneurysm, without rupture, unspecified: Secondary | ICD-10-CM

## 2019-10-07 NOTE — Telephone Encounter (Signed)
Spoke to patient-  Schedule testing prior to upcoming annual appointment . Patient is aware an agree to  Reschedule annual office appointment until ct angio of aorta and echo is complete.   appointment time for labs and echo - direction given.

## 2019-10-08 ENCOUNTER — Other Ambulatory Visit: Payer: Self-pay

## 2019-10-08 ENCOUNTER — Ambulatory Visit (HOSPITAL_COMMUNITY): Payer: Medicare Other | Attending: Cardiology

## 2019-10-08 DIAGNOSIS — I77810 Thoracic aortic ectasia: Secondary | ICD-10-CM | POA: Diagnosis present

## 2019-10-08 DIAGNOSIS — R011 Cardiac murmur, unspecified: Secondary | ICD-10-CM

## 2019-10-09 LAB — BASIC METABOLIC PANEL
BUN/Creatinine Ratio: 20 (ref 12–28)
BUN: 17 mg/dL (ref 8–27)
CO2: 26 mmol/L (ref 20–29)
Calcium: 10.2 mg/dL (ref 8.7–10.3)
Chloride: 102 mmol/L (ref 96–106)
Creatinine, Ser: 0.84 mg/dL (ref 0.57–1.00)
GFR calc Af Amer: 82 mL/min/{1.73_m2} (ref >59)
GFR calc non Af Amer: 71 mL/min/{1.73_m2} (ref >59)
Glucose: 96 mg/dL (ref 65–99)
Potassium: 4.1 mmol/L (ref 3.5–5.2)
Sodium: 142 mmol/L (ref 134–144)

## 2019-10-12 ENCOUNTER — Ambulatory Visit: Payer: Medicare Other | Admitting: Cardiology

## 2019-10-15 ENCOUNTER — Telehealth: Payer: Self-pay | Admitting: Cardiology

## 2019-10-15 NOTE — Telephone Encounter (Signed)
I do not particularly care if a spouse comes in, but I do not want to go against out policy unless she has a pressing reason for her husband to accompany her -- for instance memory issues, severe anxiety, etc.  Glenetta Hew, MD

## 2019-10-15 NOTE — Telephone Encounter (Signed)
Patient would like husband, Pilar Plate, to come with her to her next appt on 10/22/19 with Dr. Ellyn Hack. I did tell her that we are trying to limit the flow of traffic coming in and out of the office and that we are only allowing care-givers at this time. She states this is an important appt in regards to her echo results and would feel better if her husband came with her for support. Please advise.

## 2019-10-15 NOTE — Telephone Encounter (Signed)
Will route to provider- patient was told they are not allowing guest, but pushed to be seen

## 2019-10-16 NOTE — Telephone Encounter (Signed)
SPOKE TO PATIENT -  PATIENT IS VERY ANXIOUS ABOUT RESULTS -NEED MORAL SUPPORT NOTE  PLACED ON OFFICE VISIT

## 2019-10-20 ENCOUNTER — Other Ambulatory Visit: Payer: Self-pay

## 2019-10-20 ENCOUNTER — Ambulatory Visit
Admission: RE | Admit: 2019-10-20 | Discharge: 2019-10-20 | Disposition: A | Discharge status: Home / Self Care | Payer: Medicare Other | Source: Ambulatory Visit | Attending: Cardiology | Admitting: Cardiology

## 2019-10-20 DIAGNOSIS — I712 Thoracic aortic aneurysm, without rupture, unspecified: Secondary | ICD-10-CM

## 2019-10-20 DIAGNOSIS — I77810 Thoracic aortic ectasia: Secondary | ICD-10-CM

## 2019-10-20 MED ORDER — IOPAMIDOL (ISOVUE-370) INJECTION 76%
75.0000 mL | Freq: Once | INTRAVENOUS | Status: AC | PRN
  Administered 2019-10-20: 14:00:00 75 mL via INTRAVENOUS

## 2019-10-22 ENCOUNTER — Encounter: Payer: Self-pay | Admitting: Cardiology

## 2019-10-22 ENCOUNTER — Other Ambulatory Visit: Payer: Self-pay

## 2019-10-22 ENCOUNTER — Ambulatory Visit: Payer: Medicare Other | Admitting: Cardiology

## 2019-10-22 VITALS — BP 120/80 | HR 72 | Temp 98.1°F | Ht 69.0 in | Wt 197.0 lb

## 2019-10-22 DIAGNOSIS — E785 Hyperlipidemia, unspecified: Secondary | ICD-10-CM

## 2019-10-22 DIAGNOSIS — I77810 Thoracic aortic ectasia: Secondary | ICD-10-CM

## 2019-10-22 DIAGNOSIS — I359 Nonrheumatic aortic valve disorder, unspecified: Secondary | ICD-10-CM | POA: Diagnosis not present

## 2019-10-22 NOTE — Progress Notes (Signed)
Primary Care Provider: Orpah Melter, MD Cardiologist: Glenetta Hew, MD Electrophysiologist:   Clinic Note: Chief Complaint  Patient presents with  . Follow-up    12 months -dilated thoracic aorta/test results.    HPI:    [name] is a 68 y.o. female with a PMH below who presents today for annual follow-up to discuss results of echocardiogram and Chest CTA-aorta.  [name] was last seen on October 09, 2018 to discuss the results of the chest CTA showing dilated ascending thoracic aorta.  Besides noticing occasional fluttering in her chest, no real significant symptoms.  Activity is limited to hip pain but otherwise doing fine.  Recent Hospitalizations:   None since her hospitalization for right hip arthroplasty in November 2019  Reviewed  CV studies:    The following studies were reviewed today: (if available, images/films reviewed: From Epic Chart or Care Everywhere) . Echo 10/08/2019: EF 60 to 65%.  Listed is GRII DD however left atrial size is normal (does not correlate), right atrium normal size.  Aortic valve cannot exclude bicuspid, but no aortic stenosis or sclerosis.  Mild dilation of ascending aorta-4 mm.  . CTA Chest 10/20/2019: Ascending thoracic aorta greatest diameter 39 mm (compared to 2016 - 37 mm no significant change)..   Interval History:   [name] is here with her husband today to discuss results of her CT scan and echocardiogram.  They have multiple questions after hearing the report.  Very concerned about restrictive lung heart disease and possible need for transplant etc.  Very poor understanding of the results.  We went in great detail discussing the findings on the CT scan and the echocardiogram.  I explained that despite the report indicating grade 2 diastolic dysfunction, this does not correlate with normal left atrial size.  Probably not truly to the stage grade 2.  Grade 1 to be normal for age. Reassured her that the aortic size  is relatively stable from 2016.  She really does not have any untoward cardiac symptoms to speak of.  A little bit of anxiety with some fluttering sensations but no prolonged rapid irregular heartbeat/palpitations.  She may not be as active as she probably should be, but is not noticing significant exertional dyspnea or chest discomfort.   CV Review of Symptoms (Summary) Cardiovascular ROS: no chest pain or dyspnea on exertion positive for - irregular heartbeat negative for - edema, orthopnea, paroxysmal nocturnal dyspnea, shortness of breath or Syncope/near syncope, TIA/amaurosis fugax, claudication.  The patient does not have symptoms concerning for COVID-19 infection (fever, chills, cough, or new shortness of breath).  The patient is practicing social distancing. ++ Masking.  Rarely goes out for groceries/shopping.   REVIEWED OF SYSTEMS   A comprehensive ROS was performed. Review of Systems  Constitutional: Negative for malaise/fatigue and weight loss.  HENT: Negative for congestion and nosebleeds.   Respiratory: Negative for shortness of breath.   Gastrointestinal: Negative for blood in stool, heartburn and melena.  Genitourinary: Negative for hematuria.  Musculoskeletal: Positive for joint pain.  Neurological: Negative for dizziness, focal weakness and headaches.  Psychiatric/Behavioral: Negative for memory loss. The patient is nervous/anxious (Mostly since hearing the results of the tests.  Unsure what they mean). The patient does not have insomnia.     I have reviewed and (if needed) personally updated the patient's problem list, medications, allergies, past medical and surgical history, social and family history.   PAST MEDICAL HISTORY   Past Medical History:  Diagnosis Date  .  Arthritis   . Complication of anesthesia    patient report she had a saddle block during c section and was aware during surgery that was being " cut on"   . GERD (gastroesophageal reflux disease)    . Hypertension   . Thoracic ascending aortic aneurysm (HCC) 09/2015   Stable:CTA Chest 10/20/2019: Ascending thoracic aorta greatest diameter 39 mm (compared to 2016 - 37 mm no significant change).Marland Kitchen     PAST SURGICAL HISTORY   Past Surgical History:  Procedure Laterality Date  . APPENDECTOMY  2012  . bladder disection    . CESAREAN SECTION     x 2  . Chest CTA  10/2019   CTA Chest 10/20/2019: Ascending thoracic aorta greatest diameter 39 mm (compared to 2016 - 37 mm no significant change)..  . COLON RESECTION  1982   1982  . DILATION AND CURETTAGE, DIAGNOSTIC / THERAPEUTIC    . HERNIA REPAIR     mesh in place   . 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 TRANSTHORACIC ECHOCARDIOGRAM  10/2019   EF 60 to 65%.  Listed is GRII DD however left atrial size is normal (does not correlate), right atrium normal size.  Aortic valve cannot exclude bicuspid, but no aortic stenosis or sclerosis.  Mild dilation of ascending aorta-4 mm.      MEDICATIONS/ALLERGIES   No outpatient medications have been marked as taking for the 10/22/19 encounter (Office Visit) with Marykay Lex, MD.    Allergies  Allergen Reactions  . Dulera [Mometasone Furo-Formoterol Fum] Palpitations     SOCIAL HISTORY/FAMILY HISTORY   Social History   Tobacco Use  . Smoking status: Former Smoker    Packs/day: 1.00    Years: 5.00    Pack years: 5.00    Types: Cigarettes    Quit date: 12/03/1973    Years since quitting: 45.9  . Smokeless tobacco: Never Used  Substance Use Topics  . Alcohol use: Yes    Comment: wine beer and liquor on occasion   . Drug use: No   Social History   Social History Narrative  . Not on file    Family History family history includes Asthma in her brother, brother, and paternal grandfather; Clotting disorder in her  mother.   OBJCTIVE -PE, EKG, labs   Wt Readings from Last 3 Encounters:  10/22/19 197 lb (89.4 kg)  10/28/18 189 lb (85.7 kg)  10/09/18 189 lb (85.7 kg)    Physical Exam: BP 120/80 (BP Location: Left Arm, Patient Position: Sitting, Cuff Size: Normal)   Pulse 72   Temp 98.1 F (36.7 C)   Ht 5\' 9"  (1.753 m)   Wt 197 lb (89.4 kg)   BMI 29.09 kg/m  Physical Exam  Constitutional: She is oriented to person, place, and time. She appears well-developed and well-nourished. No distress.  Healthy-appearing.  Well-groomed  HENT:  Head: Normocephalic.  Neck: Normal range of motion. Neck supple. No hepatojugular reflux and no JVD present. Carotid bruit is not present.  Cardiovascular: Normal rate, regular rhythm and intact distal pulses.  Occasional extrasystoles are present. PMI is not displaced. Exam reveals no gallop and no friction rub.  Murmur (High-pitched 1/6 COPD SEM at RUSB.) heard. Pulmonary/Chest: Breath sounds normal. No respiratory distress. She has no wheezes. She has no rales.  Musculoskeletal: Normal range of motion.  General: No edema.  Neurological: She is alert and oriented to person, place, and time.  Psychiatric: Her behavior is normal. Judgment and thought content normal.  Somewhat anxious, but once we start talking, very reassured.  Vitals reviewed.   Adult ECG Report  Rate: 72 ;  Rhythm: normal sinus rhythm and Normal axis, intervals and durations.;   Narrative Interpretation: Stable EKG  Recent Labs:    No results found for: CHOL, HDL, LDLCALC, LDLDIRECT, TRIG, CHOLHDL Lab Results  Component Value Date   CREATININE 0.84 10/08/2019   BUN 17 10/08/2019   NA 142 10/08/2019   K 4.1 10/08/2019   CL 102 10/08/2019   CO2 26 10/08/2019    ASSESSMENT/PLAN    Problem List Items Addressed This Visit    Thoracic aortic ectasia (HCC) - Primary (Chronic)    Stable now since 2016 only going from 37 to 39 mm in diameter.  Correlates well with echocardiogram  findings.  I suspect this may just be her baseline.  We will alternate echocardiogram and CTA every other year to follow. Echo next year prior to follow-up.  Closely monitor blood pressure control and lipid management as risk factors.  Due for labs rechecked by PCP.      Relevant Orders   EKG 12-Lead   ECHOCARDIOGRAM COMPLETE   Aortic valve disease (Chronic)    No obvious aortic valve stenosis noted.  Perhaps because of some structural normality abnormality there is turbulent flow leading to her murmur. We will be monitoring intermittently between echocardiogram and CT.  Next year we will check follow-up echocardiogram--we will need to clarify concern for possible functional bicuspid valve..      Relevant Orders   ECHOCARDIOGRAM COMPLETE   Dyslipidemia, goal LDL below 100 (Chronic)    Based on thoracic atherosclerosis, target LDL should be less than 100 if not lower.  Has not had labs checked yet this year.  Due for PCP check.  As of 2019 LDL was 101 and total cholesterol 185.  HDL 54 intervention is 154.  If lipids trend upward, would potentially consider low-dose statin.  Will defer to PCP.          COVID-19 Education: The signs and symptoms of COVID-19 were discussed with the patient and how to seek care for testing (follow up with PCP or arrange E-visit).   The importance of social distancing was discussed today.  I spent a total of 30 minutes with the patient and chart review. >  50% of the time was spent in direct patient consultation.  Additional time spent with chart review (studies, outside notes, etc): 12 Total Time: 42 min --> Extensive time spent going through the details of each study and the results.  We then reviewed pathophysiology of aortic dilation and the natural progression.  Her husband was present with multiple questions as well.   Current medicines are reviewed at length with the patient today.  (+/- concerns) n/a   Patient Instructions / Medication  Changes & Studies & Tests Ordered   Patient Instructions  Medication Instructions:  NO CHANGES   Lab Work: NOT NEEDED  Testing/Procedures: WILL NEED TO SCHEDULE IN 1 YEAR - ECHO  2021 Your physician has requested that you have an echocardiogram. Echocardiography is a painless test that uses sound waves to create images of your heart. It provides your doctor with information about the size and shape of your heart and how well your heart's chambers and valves are working. This procedure takes approximately one hour.  There are no restrictions for this procedure.    Follow-Up: At [facility], you and your health needs are our priority.  As part of our continuing mission to provide you with exceptional heart care, we have created designated Provider Care Teams.  These Care Teams include your primary Cardiologist (physician) and Advanced Practice Providers (APPs -  Physician Assistants and Nurse Practitioners) who all work together to provide you with the care you need, when you need it.  Your next appointment:   12 month(s)  The format for your next appointment:   In Person  Provider:   Bryan Lemma, MD  Other Instructions     Studies Ordered:   Orders Placed This Encounter  Procedures  . EKG 12-Lead  . ECHOCARDIOGRAM COMPLETE     Bryan Lemma, M.D., M.S. Interventional Cardiologist   Pager # [phone] Phone # [phone] [address] [place], [place] [address]   Thank you for choosing Heartcare at [facility]!!

## 2019-10-22 NOTE — Patient Instructions (Signed)
Medication Instructions:  NO CHANGES   Lab Work: NOT NEEDED  Testing/Procedures: WILL NEED TO SCHEDULE IN 1 YEAR - ECHO  2021 Your physician has requested that you have an echocardiogram. Echocardiography is a painless test that uses sound waves to create images of your heart. It provides your doctor with information about the size and shape of your heart and how well your heart's chambers and valves are working. This procedure takes approximately one hour. There are no restrictions for this procedure.    Follow-Up: At [facility], you and your health needs are our priority.  As part of our continuing mission to provide you with exceptional heart care, we have created designated Provider Care Teams.  These Care Teams include your primary Cardiologist (physician) and Advanced Practice Providers (APPs -  Physician Assistants and Nurse Practitioners) who all work together to provide you with the care you need, when you need it.  Your next appointment:   12 month(s)  The format for your next appointment:   In Person  Provider:   Glenetta Hew, MD  Other Instructions

## 2019-10-25 ENCOUNTER — Encounter: Payer: Self-pay | Admitting: Cardiology

## 2019-10-25 NOTE — Assessment & Plan Note (Signed)
Stable now since 2016 only going from 37 to 39 mm in diameter.  Correlates well with echocardiogram findings.  I suspect this may just be her baseline.  We will alternate echocardiogram and CTA every other year to follow. Echo next year prior to follow-up.  Closely monitor blood pressure control and lipid management as risk factors.  Due for labs rechecked by PCP.

## 2019-10-25 NOTE — Assessment & Plan Note (Addendum)
No obvious aortic valve stenosis noted.  Perhaps because of some structural normality abnormality there is turbulent flow leading to her murmur. We will be monitoring intermittently between echocardiogram and CT.  Next year we will check follow-up echocardiogram--we will need to clarify concern for possible functional bicuspid valve.Marland Kitchen

## 2019-10-25 NOTE — Assessment & Plan Note (Signed)
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  Has not had labs checked yet this year.  Due for PCP check.  As of 2019 LDL was 101 and total cholesterol 185.  HDL 54 intervention is 154.  If lipids trend upward, would potentially consider low-dose statin.  Will defer to PCP.

## 2020-01-02 ENCOUNTER — Ambulatory Visit: Payer: Medicare Other

## 2020-01-07 ENCOUNTER — Ambulatory Visit: Payer: Medicare Other | Attending: Internal Medicine

## 2020-01-07 DIAGNOSIS — Z23 Encounter for immunization: Secondary | ICD-10-CM | POA: Insufficient documentation

## 2020-01-07 NOTE — Progress Notes (Signed)
   Covid-19 Vaccination Clinic  Name:  [name]    MRN: [id number] DOB: August 10, 1951  01/07/2020  [name] was observed post Covid-19 immunization for 15 minutes without incidence. She was provided with Vaccine Information Sheet and instruction to access the V-Safe system.   [name] was instructed to call 911 with any severe reactions post vaccine: Marland Kitchen Difficulty breathing  . Swelling of your face and throat  . A fast heartbeat  . A bad rash all over your body  . Dizziness and weakness    Immunizations Administered    Name Date Dose VIS Date Route   Pfizer COVID-19 Vaccine 01/07/2020  8:56 AM 0.3 mL 11/13/2019 Intramuscular   Manufacturer: [employer], [employer]   Lot: [id number]   NDC: [address]-2

## 2020-01-13 ENCOUNTER — Ambulatory Visit: Payer: Medicare Other

## 2020-02-01 ENCOUNTER — Ambulatory Visit: Payer: Medicare Other | Attending: Internal Medicine

## 2020-02-01 DIAGNOSIS — Z23 Encounter for immunization: Secondary | ICD-10-CM | POA: Insufficient documentation

## 2020-02-01 NOTE — Progress Notes (Signed)
   Covid-19 Vaccination Clinic  Name:  [name]    MRN: [id number] DOB: Dec 25, 1950  02/01/2020  [name] was observed post Covid-19 immunization for 15 minutes without incidence. She was provided with Vaccine Information Sheet and instruction to access the V-Safe system.   [name] was instructed to call 911 with any severe reactions post vaccine: Marland Kitchen Difficulty breathing  . Swelling of your face and throat  . A fast heartbeat  . A bad rash all over your body  . Dizziness and weakness    Immunizations Administered    Name Date Dose VIS Date Route   Pfizer COVID-19 Vaccine 02/01/2020  9:35 AM 0.3 mL 11/13/2019 Intramuscular   Manufacturer: [employer], [employer]   Lot: [id number]   NDC: [address]-8

## 2020-08-12 ENCOUNTER — Telehealth: Payer: Self-pay | Admitting: Cardiology

## 2020-08-12 NOTE — Telephone Encounter (Signed)
Spoke to patient she stated GI Dr.is ordering a ct of abdomen.Stated she is due for a chest ct to follow up AAA and she would like both done at the same time.Stated she will ask Dr.Outlaw to order chest ct too.Advised to have him sent report to Dr.Harding.

## 2020-08-12 NOTE — Telephone Encounter (Signed)
Patient is requesting an order to have a CT scan prior to appointment with Dr. Herbie Baltimore scheduled for 10/21/20 at 9:20 AM. She states she would like to coordinate her cardiac CT with another CT she is to have yearly. Please return call to discuss.

## 2020-08-17 NOTE — Telephone Encounter (Signed)
Returned call to April.  Advised patient not due for repeat CTA chest/aorta until 2022.  She is having echo this year (scheduled November 2021).     Per OV note: We will alternate echocardiogram and CTA every other year to follow. Echo next year prior to follow-up.   April aware and verbalized understanding.

## 2020-08-17 NOTE — Telephone Encounter (Signed)
[name] with Dr. Hulen Shouts office is calling to follow up. She would like to know the diagnoses and what the order for the ct needs to consist of. If [name] is not available when a call is returned to her she states a detailed voice message may be left at [phone].

## 2020-08-18 ENCOUNTER — Other Ambulatory Visit: Payer: Self-pay | Admitting: Gastroenterology

## 2020-08-18 DIAGNOSIS — R1030 Lower abdominal pain, unspecified: Secondary | ICD-10-CM

## 2020-08-24 ENCOUNTER — Ambulatory Visit
Admission: RE | Admit: 2020-08-24 | Discharge: 2020-08-24 | Disposition: A | Discharge status: Home / Self Care | Payer: Medicare Other | Source: Ambulatory Visit | Attending: Gastroenterology | Admitting: Gastroenterology

## 2020-08-24 DIAGNOSIS — R1030 Lower abdominal pain, unspecified: Secondary | ICD-10-CM

## 2020-08-24 MED ORDER — IOPAMIDOL (ISOVUE-300) INJECTION 61%
100.0000 mL | Freq: Once | INTRAVENOUS | Status: AC | PRN
  Administered 2020-08-24: 100 mL via INTRAVENOUS

## 2020-09-27 ENCOUNTER — Ambulatory Visit: Payer: Self-pay | Admitting: General Surgery

## 2020-09-27 NOTE — H&P (Signed)
History of Present Illness Joanne Filler MD; 09/27/2020 2:36 PM) The patient is a 69 year old female who presents with an incisional hernia. Referred by: Dr. Dulce Sellar Chief Complaint: Incisional hernia  Patient is a 69 year old female, who comes in secondary to an incisional hernia. Patient states that she had a C-section in 1982 with a bowel injury as well as bowel resection. Subsequent patient had a hernia that was seen unscented C-section 1987. Patient underwent incisional hernia repair with mesh. Patient states that recently she noticed that the hernias gotten larger. She status post bulge. She sees the bulge increased in size secondary to asthma exacerbations. Patient did undergo CT scan in September 21. This did show a incisional hernia in the left lower quadrant area. This appears to be area of the previous hernia repair. There appears to be metallic staples in place at the level of the fascia. There appears to be a herniated retroperitoneal fat back to be seen likely caused the bulge. Modifying factors:   Past Surgical History (Chanel Lonni Fix, CMA; 09/27/2020 2:13 PM) Appendectomy  Cesarean Section - Multiple  Hip Surgery  Right. Oral Surgery  Tonsillectomy   Diagnostic Studies History (Chanel Lonni Fix, CMA; 09/27/2020 2:13 PM) Colonoscopy  within last year Mammogram  within last year Pap Smear  1-5 years ago  Allergies Hedy Camara Lonni Fix, CMA; 09/27/2020 2:14 PM) Elwin Sleight *ANTIASTHMATIC AND BRONCHODILATOR AGENTS*  Allergies Reconciled   Medication History (Chanel Lonni Fix, CMA; 09/27/2020 2:14 PM) hydroCHLOROthiazide (25MG  Tablet, Oral) Active. Omeprazole (20MG  Capsule DR, Oral) Active. Flovent (44MCG/ACT Aerosol, Inhalation) Active. Medications Reconciled  Social History , CMA; 09/27/2020 2:13 PM) Alcohol use  Occasional alcohol use. Caffeine use  Carbonated beverages, Coffee, Tea. No drug use  Tobacco use  Former smoker.  Family History  Micheal Likens, CMA; 09/27/2020 2:13 PM) Arthritis  Mother. Bleeding disorder  Mother. Colon Polyps  Brother. Diabetes Mellitus  Brother. Hypertension  Brother, Father, Mother. Kidney Disease  Brother. Melanoma  Brother. Respiratory Condition  Brother. Thyroid problems  Mother.  Pregnancy / Birth History Micheal Likens, CMA; 09/27/2020 2:13 PM) Age at menarche  14 years. Age of menopause  76-55 Gravida  2 Length (months) of breastfeeding  7-12 Maternal age  71-30 Para  2  Other Problems 46-48, CMA; 09/27/2020 2:13 PM) Asthma  Bladder Problems  Gastroesophageal Reflux Disease  Hemorrhoids  High blood pressure  Vascular Disease     Review of Systems Micheal Likens MD; 09/27/2020 2:34 PM) General Not Present- Appetite Loss, Chills, Fatigue, Fever, Night Sweats, Weight Gain and Weight Loss. Skin Not Present- Change in Wart/Mole, Dryness, Hives, Jaundice, New Lesions, Non-Healing Wounds, Rash and Ulcer. HEENT Present- Hearing Loss and Wears glasses/contact lenses. Not Present- Earache, Hoarseness, Nose Bleed, Oral Ulcers, Ringing in the Ears, Seasonal Allergies, Sinus Pain, Sore Throat, Visual Disturbances and Yellow Eyes. Respiratory Present- Chronic Cough. Not Present- Bloody sputum, Difficulty Breathing, Snoring and Wheezing. Breast Not Present- Breast Mass, Breast Pain, Nipple Discharge and Skin Changes. Cardiovascular Not Present- Chest Pain, Difficulty Breathing Lying Down, Leg Cramps, Palpitations, Rapid Heart Rate, Shortness of Breath and Swelling of Extremities. Gastrointestinal Present- Change in Bowel Habits and Hemorrhoids. Not Present- Abdominal Pain, Bloating, Bloody Stool, Chronic diarrhea, Constipation, Difficulty Swallowing, Excessive gas, Gets full quickly at meals, Indigestion, Nausea, Rectal Pain and Vomiting. Female Genitourinary Not Present- Frequency, Nocturia, Painful Urination, Pelvic Pain and Urgency. Musculoskeletal Not Present-  Back Pain, Joint Pain, Joint Stiffness, Muscle Pain, Muscle Weakness and Swelling of Extremities. Neurological Present- Numbness. Not Present- Decreased Memory, Fainting,  Headaches, Seizures, Tingling, Tremor, Trouble walking and Weakness. Psychiatric Not Present- Anxiety, Bipolar, Change in Sleep Pattern, Depression, Fearful and Frequent crying. Endocrine Present- Hot flashes. Not Present- Cold Intolerance, Excessive Hunger, Hair Changes, Heat Intolerance and New Diabetes. Hematology Not Present- Blood Thinners, Easy Bruising, Excessive bleeding, Gland problems, HIV and Persistent Infections. All other systems negative  Vitals (Chanel Nolan CMA; 09/27/2020 2:15 PM) 09/27/2020 2:14 PM Weight: 195.25 lb Height: 69in Body Surface Area: 2.04 m Body Mass Index: 28.83 kg/m  Temp.: 62F  Pulse: 96 (Regular)  BP: 120/74(Sitting, Left Arm, Standard)       Physical Exam Joanne Filler MD; 09/27/2020 2:37 PM) The physical exam findings are as follows: Note: Constitutional: No acute distress, conversant, appears stated age  Eyes: Anicteric sclerae, moist conjunctiva, no lid lag  Neck: No thyromegaly, trachea midline, no cervical lymphadenopathy  Lungs: Clear to auscultation biilaterally, normal respiratory effot  Cardiovascular: regular rate & rhythm, no murmurs, no peripheal edema, pedal pulses 2+  GI: Soft, no masses or hepatosplenomegaly, non-tender to palpation  MSK: Normal gait, no clubbing cyanosis, edema  Skin: No rashes, palpation reveals normal skin turgor  Psychiatric: Appropriate judgment and insight, oriented to person, place, and time  Abdomen Inspection Hernias - Incisional - Incarcerated (69 year old female with a left lower quadrant incisional hernia from her previous hernia repair with mesh.) .    Assessment & Plan Joanne Filler MD; 09/27/2020 2:38 PM) INCISIONAL HERNIA, WITHOUT OBSTRUCTION OR GANGRENE (K43.2) Impression: Patient is a  69 year old female, comes in secondary to a left lower quadrant incisional hernia. This appears to be recurrent.  1. Will proceed to the operating room for robotic I's adhesions, incisional hernia repair with mesh. All risks and benefits were discussed with the patient to generally include, but not limited to: infection, bleeding, damage to surrounding structures, acute and chronic nerve pain, and recurrence. Alternatives were offered and described. All questions were answered and the patient voiced understanding of the procedure and wishes to proceed at this point with hernia repair.  I reviewed the patient's external notes from the referring physicians as well as consulting physician team. Each of the radiologic studies and lab studies were independently reviewed and interpreted. I discussed the results of the above studies and how they relate to the patient's surgical problems.

## 2020-10-03 HISTORY — PX: TRANSTHORACIC ECHOCARDIOGRAM: SHX275

## 2020-10-12 ENCOUNTER — Other Ambulatory Visit: Payer: Self-pay

## 2020-10-12 ENCOUNTER — Ambulatory Visit (HOSPITAL_COMMUNITY): Payer: Medicare Other | Attending: Cardiovascular Disease

## 2020-10-12 DIAGNOSIS — I359 Nonrheumatic aortic valve disorder, unspecified: Secondary | ICD-10-CM | POA: Diagnosis not present

## 2020-10-12 DIAGNOSIS — I77810 Thoracic aortic ectasia: Secondary | ICD-10-CM | POA: Diagnosis not present

## 2020-10-12 LAB — ECHOCARDIOGRAM COMPLETE
Area-P 1/2: 2.53 cm2
S' Lateral: 3.4 cm

## 2020-10-21 ENCOUNTER — Other Ambulatory Visit: Payer: Self-pay | Admitting: *Deleted

## 2020-10-21 ENCOUNTER — Encounter: Payer: Self-pay | Admitting: Cardiology

## 2020-10-21 ENCOUNTER — Other Ambulatory Visit: Payer: Self-pay

## 2020-10-21 ENCOUNTER — Ambulatory Visit: Payer: Medicare Other | Admitting: Cardiology

## 2020-10-21 VITALS — BP 130/62 | HR 71 | Temp 97.9°F | Ht 69.0 in | Wt 196.2 lb

## 2020-10-21 DIAGNOSIS — Z01810 Encounter for preprocedural cardiovascular examination: Secondary | ICD-10-CM | POA: Diagnosis not present

## 2020-10-21 DIAGNOSIS — I712 Thoracic aortic aneurysm, without rupture, unspecified: Secondary | ICD-10-CM

## 2020-10-21 DIAGNOSIS — R011 Cardiac murmur, unspecified: Secondary | ICD-10-CM

## 2020-10-21 DIAGNOSIS — I77810 Thoracic aortic ectasia: Secondary | ICD-10-CM

## 2020-10-21 DIAGNOSIS — E785 Hyperlipidemia, unspecified: Secondary | ICD-10-CM

## 2020-10-21 DIAGNOSIS — I359 Nonrheumatic aortic valve disorder, unspecified: Secondary | ICD-10-CM

## 2020-10-21 NOTE — Patient Instructions (Addendum)
Medication Instructions:  No changes *If you need a refill on your cardiac medications before your next appointment, please call your pharmacy*   Lab Work:  may need BMP for CTA of Aorta  Nov 2022   If you have labs (blood work) drawn today and your tests are completely normal, you will receive your results only by: Marland Kitchen MyChart Message (if you have MyChart) OR . A paper copy in the mail If you have any lab test that is abnormal or we need to change your treatment, we will call you to review the results.   Testing/Procedures:  schedule  In Nov 2022- [address] -- [place] [facility] Non-Cardiac CT Angiography (CTA) of aorta , is a special type of CT scan that uses a computer to produce multi-dimensional views of major blood vessels throughout the body. In CT angiography, a contrast material is injected through an IV to help visualize the blood vessels  Follow-Up: At [facility], you and your health needs are our priority.  As part of our continuing mission to provide you with exceptional heart care, we have created designated Provider Care Teams.  These Care Teams include your primary Cardiologist (physician) and Advanced Practice Providers (APPs -  Physician Assistants and Nurse Practitioners) who all work together to provide you with the care you need, when you need it.  We recommend signing up for the patient portal called "MyChart".  Sign up information is provided on this After Visit Summary.  MyChart is used to connect with patients for Virtual Visits (Telemedicine).  Patients are able to view lab/test results, encounter notes, upcoming appointments, etc.  Non-urgent messages can be sent to your provider as well.   To learn more about what you can do with MyChart, go to [email or web address].    Your next appointment:   12 month(s)  The format for your next appointment:   In Person  Provider:   Bryan Lemma, MD

## 2020-10-21 NOTE — Progress Notes (Signed)
Primary Care Provider: Joycelyn Rua, MD Cardiologist: Bryan Lemma, MD Electrophysiologist: None  Clinic Note: Chief Complaint  Patient presents with  . Follow-up    annual    HPI:    [name] is a 69 y.o. female with a PMH notable for Ectatic Thoracic Aorta who presents today for annual follow-up.   Echo 10/08/2019: EF 60 to 65%.  Listed is GRII DD however left atrial size is normal (does not correlate), right atrium normal size.  Aortic valve cannot exclude bicuspid, but no aortic stenosis or sclerosis.  Mild dilation of ascending aorta-4 mm.   CTA Chest 10/20/2019: Ascending thoracic aorta greatest diameter 39 mm (compared to 2016 - 37 mm no significant change)..  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    Plan - CTAngio Chest-Aorta Nov 2022. - if stable - would change to every 2-3 yr check.      Relevant Orders   CT ANGIO CHEST AORTA W/CM & OR WO/CM   Basic metabolic panel   Aortic valve disease (Chronic)    No suggestion of Aortic Stenosis on Echo      Relevant Orders   EKG 12-Lead (Completed)   CT ANGIO CHEST AORTA W/CM & OR WO/CM   Basic metabolic panel   Dyslipidemia, goal LDL below 100 (Chronic)    Due for Lipid levels to be checked next month -  Remains off of statin --> if LDL remains >> 100, would consider low dose Rosuvastatin ~ 10-20 mg.       Preoperative cardiovascular examination    Due for possible Hernia Sgx soon -- NO NEED for any additional Cardiac Evaluation.  LOW RISK patient for LOW RISK surgery.        [name] was last seen on 10/22/2019: Plan was to alternate CTA chest and echo every other year to evaluate to follow aortic dilation.  Echo 10/08/2019: EF 60 to 65%.  Listed is GRII DD however left atrial size is normal (does not correlate), right atrium normal size.  Aortic valve cannot  exclude bicuspid, but no aortic stenosis or sclerosis.  Mild dilation of ascending aorta-4 mm.   CTA Chest 10/20/2019: Ascending thoracic aorta greatest diameter 39 mm (compared to 2016 - 37 mm no significant change)..  Recent Hospitalizations: none  Reviewed  CV studies:    The following studies were reviewed today: (if available, images/films reviewed: From Epic Chart or Care Everywhere) . TTE 10/12/2020: EF 55 to 60%.  No R WMA.  Normal diastolic parameters.  Mild LA dilation.  Normal aortic valve.  Stable aortic dilation-39 mm  Interval History:   [name] returns for annual follow-up doing well with no major issues.  She dose Water Aerobics 2-3 d/week without any SSx of exertional CP or dyspnea.  BP remains stable.    CV Review of Symptoms (Summary): no chest pain or dyspnea on exertion negative for - edema, irregular heartbeat, orthopnea, palpitations, paroxysmal nocturnal dyspnea, rapid heart rate, shortness of breath or syncope/near syncope, TIA/amaurosis fugax, claudication  The patient does not have symptoms concerning for COVID-19 infection (fever, chills, cough, or new shortness of breath).   REVIEWED OF SYSTEMS   Review of Systems  Constitutional: Negative for malaise/fatigue and weight loss.  HENT: Negative for congestion.   Respiratory: Negative for shortness of breath.  Gastrointestinal: Negative for blood in stool and melena.  Genitourinary: Negative for hematuria.  Musculoskeletal: Negative for joint pain.  Neurological: Negative for dizziness.  Psychiatric/Behavioral: Negative for memory loss. The patient is not nervous/anxious and does not have insomnia.    I have reviewed and (if needed) personally updated the patient's problem list, medications, allergies, past medical and surgical history, social and family history.   PAST MEDICAL HISTORY   Past Medical History:  Diagnosis Date  . Arthritis   . Complication of anesthesia    patient report she had  a saddle block during c section and was aware during surgery that was being " cut on"   . GERD (gastroesophageal reflux disease)   . Hypertension   . Thoracic ascending aortic aneurysm (HCC) 09/2015   Stable:CTA Chest 10/20/2019: Ascending thoracic aorta greatest diameter 39 mm (compared to 2016 - 37 mm no significant change).Marland Kitchen.    PAST SURGICAL HISTORY   Past Surgical History:  Procedure Laterality Date  . APPENDECTOMY  2012  . bladder disection    . CESAREAN SECTION     x 2  . Chest CTA  10/2019   CTA Chest 10/20/2019: Ascending thoracic aorta greatest diameter 39 mm (compared to 2016 - 37 mm no significant change)..  . COLON RESECTION  1982   1982  . DILATION AND CURETTAGE, DIAGNOSTIC / THERAPEUTIC    . HERNIA REPAIR     mesh in place   . TOTAL HIP ARTHROPLASTY Right 10/28/2018   Procedure: RIGHT TOTAL HIP ARTHROPLASTY ANTERIOR APPROACH;  Surgeon: Durene Romanslin, Matthew, MD;  Location: [facility];  Service: Orthopedics;  Laterality: Right;  70 mins  . TRANSTHORACIC ECHOCARDIOGRAM  10/2019   EF 60 to 65%.  Listed is GRII DD however left atrial size is normal (does not correlate), right atrium normal size.  Aortic valve cannot exclude bicuspid, but no aortic stenosis or sclerosis.  Mild dilation of ascending aorta-4 mm.     Immunization History  Administered Date(s) Administered  . Influenza Split 09/02/2014, 08/04/2015  . Influenza, High Dose Seasonal PF 09/02/2017  . PFIZER SARS-COV-2 Vaccination 01/07/2020, 02/01/2020  . Pneumococcal Conjugate-13 01/09/2016    MEDICATIONS/ALLERGIES   Current Meds  Medication Sig  . Ascorbic Acid (VITAMIN C PO) Take 1 tablet by mouth daily.   Marland Kitchen. b complex vitamins tablet Take 1 tablet by mouth daily.  Marland Kitchen. CALCIUM-MAGNESIUM PO Take 2 tablets by mouth daily.  . cholecalciferol (VITAMIN D) 1000 UNITS tablet Take 1,000 Units by mouth daily.  Marland Kitchen. FIBER DIET TABS Take 1 tablet by mouth daily.  Marland Kitchen. glucosamine-chondroitin 500-400 MG tablet Take 1 tablet by mouth  daily.   . hydrochlorothiazide (HYDRODIURIL) 25 MG tablet Take 12.5 mg by mouth daily.   Marland Kitchen. LECITHIN PO Take 1 tablet by mouth daily.  . Multiple Vitamin (MULTIVITAMIN) capsule Take 1 capsule by mouth daily.  Marland Kitchen. omeprazole (PRILOSEC) 20 MG capsule Take 20 mg by mouth daily.  . RESTASIS 0.05 % ophthalmic emulsion Place 1 drop into both eyes 2 (two) times daily.     Allergies  Allergen Reactions  . Dulera [Mometasone Furo-Formoterol Fum] Palpitations    SOCIAL HISTORY/FAMILY HISTORY   Reviewed in Epic:  Pertinent findings: none  OBJCTIVE -PE, EKG, labs   Wt Readings from Last 3 Encounters:  10/21/20 196 lb 3.2 oz (89 kg)  10/22/19 197 lb (89.4 kg)  10/28/18 189 lb (85.7 kg)    Physical Exam: BP 130/62   Pulse 71   Temp 97.9 F (36.6 C)  Ht 5\' 9"  (1.753 m)   Wt 196 lb 3.2 oz (89 kg)   SpO2 95%   BMI 28.97 kg/m  Physical Exam Vitals reviewed.  Constitutional:      General: She is not in acute distress.    Appearance: Normal appearance. She is normal weight. She is not ill-appearing or toxic-appearing.  HENT:     Head: Normocephalic and atraumatic.  Neck:     Vascular: No carotid bruit, hepatojugular reflux or JVD.  Cardiovascular:     Rate and Rhythm: Normal rate and regular rhythm. Occasional extrasystoles are present.    Chest Wall: PMI is not displaced.     Heart sounds: S1 normal and S2 normal. Murmur (high pitched 1/6 SEM @ RUSB) heard.  No friction rub. No S4 sounds.   Pulmonary:     Effort: Pulmonary effort is normal. No respiratory distress.     Breath sounds: Normal breath sounds.  Chest:     Chest wall: No tenderness.  Musculoskeletal:        General: No swelling. Normal range of motion.     Cervical back: Normal range of motion and neck supple.  Neurological:     General: No focal deficit present.     Mental Status: She is alert and oriented to person, place, and time.  Psychiatric:        Mood and Affect: Mood normal.        Behavior: Behavior  normal.        Thought Content: Thought content normal.        Judgment: Judgment normal.     Adult ECG Report  Rate: 71 ;  Rhythm: normal sinus rhythm and normal axis, intervals & durations;   Narrative Interpretation: normal EKG  Recent Labs:  Due for check with PCP soon.   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    Plan - CTAngio Chest-Aorta Nov 2022. - if stable - would change to every 2-3 yr check.      Relevant Orders   CT ANGIO CHEST AORTA W/CM & OR WO/CM   Basic metabolic panel   Aortic valve disease (Chronic)    No suggestion of Aortic Stenosis on Echo      Relevant Orders   EKG 12-Lead (Completed)   CT ANGIO CHEST AORTA W/CM & OR WO/CM   Basic metabolic panel   Dyslipidemia, goal LDL below 100 (Chronic)    Due for Lipid levels to be checked next month -  Remains off of statin --> if LDL remains >> 100, would consider low dose Rosuvastatin ~ 10-20 mg.       Preoperative cardiovascular examination    Due for possible Hernia Sgx soon -- NO NEED for any additional Cardiac Evaluation.  LOW RISK patient for LOW RISK surgery.          COVID-19 Education: The signs and symptoms of COVID-19 were discussed with the patient and how to seek care for testing (follow up with PCP or arrange E-visit).  The importance of social distancing and COVID-19 vaccination was discussed today.  The patient is practicing social distancing & Masking.   I spent a total of 28 minutes with the patient spent in direct patient consultation.  Additional time spent with  chart review  / charting (studies, outside notes, etc): 4 Total Time: 32 min   Current medicines are reviewed at length with the patient today.  (+/- concerns) n/a  This visit occurred during the SARS-CoV-2 public health emergency.  Safety protocols were in place, including screening questions prior to the visit, additional usage of staff PPE, and extensive cleaning of exam room while observing appropriate contact time as indicated for disinfecting solutions.  Notice: This dictation was prepared with Dragon dictation along with smaller phrase technology. Any transcriptional errors that result from this process are unintentional and may not be corrected upon review.  Patient Instructions / Medication Changes & Studies & Tests Ordered   Patient Instructions  Medication Instructions:  No changes *If you need a refill on your cardiac medications before your next appointment, please call your pharmacy*   Lab Work:  may need BMP for CTA of Aorta  Nov 2022   If you have labs (blood work) drawn today and your tests are completely normal, you will receive your results only by: Marland Kitchen MyChart Message (if you have MyChart) OR . A paper copy in the mail If you have any lab test that is abnormal or we need to change your treatment, we will call you to review the results.   Testing/Procedures:  schedule  In Nov 2022- [address] -- [facility] Non-Cardiac CT Angiography (CTA) of aorta , is a special type of CT scan that uses a computer to produce multi-dimensional views of major blood vessels throughout the body. In CT angiography, a contrast material is injected through an IV to help visualize the blood vessels  Follow-Up: At [facility], you and your health needs are our priority.  As part of our continuing mission to provide you with exceptional heart care, we have created designated Provider Care Teams.  These Care Teams include your primary Cardiologist (physician) and Advanced  Practice Providers (APPs -  Physician Assistants and Nurse Practitioners) who all work together to provide you with the care you need, when you need it.  We recommend signing up for the patient portal called "MyChart".  Sign up information is provided on this After Visit Summary.  MyChart is used to connect with patients for Virtual Visits (Telemedicine).  Patients are able to view lab/test results, encounter notes, upcoming appointments, etc.  Non-urgent messages can be sent to your provider as well.   To learn more about what you can do with MyChart, go to [email or web address].    Your next appointment:   12 month(s)  The format for your next appointment:   In Person  Provider:   Bryan Lemma, MD   Studies Ordered:   Orders Placed This Encounter  Procedures  . CT ANGIO CHEST AORTA W/CM & OR WO/CM  . Basic metabolic panel  . EKG 12-Lead     Bryan Lemma, M.D., M.S. Interventional Cardiologist   Pager # [phone] Phone # [phone] [address] [place], [place] [address]   Thank you for choosing Heartcare at [facility]!!

## 2020-11-05 ENCOUNTER — Encounter: Payer: Self-pay | Admitting: Cardiology

## 2020-11-05 NOTE — Assessment & Plan Note (Signed)
Followed with Echo this year - Stable Ascending Ao - 39 mm - correlates with CTA.    Plan - CTAngio Chest-Aorta Nov 2022. - if stable - would change to every 2-3 yr check.

## 2020-11-05 NOTE — Assessment & Plan Note (Signed)
Due for Lipid levels to be checked next month -  Remains off of statin --> if LDL remains >> 100, would consider low dose Rosuvastatin ~ 10-20 mg.

## 2020-11-05 NOTE — Assessment & Plan Note (Signed)
Due for possible Hernia Sgx soon -- NO NEED for any additional Cardiac Evaluation.  LOW RISK patient for LOW RISK surgery.

## 2020-11-05 NOTE — Assessment & Plan Note (Signed)
No suggestion of Aortic Stenosis on Echo

## 2020-12-06 NOTE — Pre-Procedure Instructions (Addendum)
[facility] [address], [place] - [address] [place] [address] Phone: [phone] Fax: [phone]  [facility] - [place], [place] - [address] [place], [address] [place], [address] [place] [address] Phone: [phone] Fax: [phone]      Your procedure is scheduled on Tuesday, January 11th at 12:00 P.M.  Report to [facility] Main Entrance "A" at 10:00 A.M., and check in at the Admitting office.  Call this number if you have problems the morning of surgery:  [phone]  Call [phone] if you have any questions prior to your surgery date Monday-Friday 8am-4pm    Remember:  Do not eat after midnight the night before your surgery  You may drink clear liquids until 11:00 a.m. the morning of your surgery.   Clear liquids allowed are: Water, Non-Citrus Juices (without pulp), Carbonated Beverages, Clear Tea, Black Coffee Only, and Gatorade.    Take these medicines the morning of surgery with A SIP OF WATER omeprazole (PRILOSEC) loratadine (CLARITIN)-use as needed Eye drops as needed.  As of today, STOP taking any Aspirin (unless otherwise instructed by your surgeon) Aleve, Naproxen, Ibuprofen, Motrin, Advil, Goody's, BC's, all herbal medications, fish oil, and all vitamins.                      Do not wear jewelry, make up, or nail polish            Do not wear lotions, powders, perfumes, or deodorant.            Do not shave 48 hours prior to surgery.             Do not bring valuables to the hospital.            [facility] is not responsible for any belongings or valuables.  Do NOT Smoke (Tobacco/Vaping) or drink Alcohol 24 hours prior to your procedure If you use a CPAP at night, you may bring all equipment for your overnight stay.   Contacts, glasses, dentures or bridgework may not be worn into surgery.      For patients admitted to the hospital, discharge time will be determined by  your treatment team.   Patients discharged the day of surgery will not be allowed to drive home, and someone needs to stay with them for 24 hours.    Special instructions:   - Preparing For Surgery  Before surgery, you can play an important role. Because skin is not sterile, your skin needs to be as free of germs as possible. You can reduce the number of germs on your skin by washing with CHG (chlorahexidine gluconate) Soap before surgery.  CHG is an antiseptic cleaner which kills germs and bonds with the skin to continue killing germs even after washing.    Oral Hygiene is also important to reduce your risk of infection.  Remember - BRUSH YOUR TEETH THE MORNING OF SURGERY WITH YOUR REGULAR TOOTHPASTE  Please do not use if you have an allergy to CHG or antibacterial soaps. If your skin becomes reddened/irritated stop using the CHG.  Do not shave (including legs and underarms) for at least 48 hours prior to first CHG shower. It is OK to shave your face.  Please follow these instructions carefully.   1. Shower the NIGHT BEFORE SURGERY and the MORNING OF SURGERY with CHG Soap.   2. If you chose to wash your hair, wash your  hair first as usual with your normal shampoo.  3. After you shampoo, rinse your hair and body thoroughly to remove the shampoo.  4. Use CHG as you would any other liquid soap. You can apply CHG directly to the skin and wash gently with a scrungie or a clean washcloth.   5. Apply the CHG Soap to your body ONLY FROM THE NECK DOWN.  Do not use on open wounds or open sores. Avoid contact with your eyes, ears, mouth and genitals (private parts). Wash Face and genitals (private parts)  with your normal soap.   6. Wash thoroughly, paying special attention to the area where your surgery will be performed.  7. Thoroughly rinse your body with warm water from the neck down.  8. DO NOT shower/wash with your normal soap after using and rinsing off the CHG Soap.  9. Pat  yourself dry with a CLEAN TOWEL.  10. Wear CLEAN PAJAMAS to bed the night before surgery  11. Place CLEAN SHEETS on your bed the night of your first shower and DO NOT SLEEP WITH PETS.   Day of Surgery: Wear Clean/Comfortable clothing the morning of surgery Do not apply any deodorants/lotions.   Remember to brush your teeth WITH YOUR REGULAR TOOTHPASTE.   Please read over the following fact sheets that you were given.

## 2020-12-07 ENCOUNTER — Encounter (HOSPITAL_COMMUNITY)
Admission: RE | Admit: 2020-12-07 | Discharge: 2020-12-07 | Disposition: A | Discharge status: Home / Self Care | Payer: Medicare Other | Source: Ambulatory Visit | Attending: General Surgery | Admitting: General Surgery

## 2020-12-07 ENCOUNTER — Encounter (HOSPITAL_COMMUNITY): Payer: Self-pay

## 2020-12-07 ENCOUNTER — Other Ambulatory Visit: Payer: Self-pay

## 2020-12-07 DIAGNOSIS — E785 Hyperlipidemia, unspecified: Secondary | ICD-10-CM | POA: Insufficient documentation

## 2020-12-07 DIAGNOSIS — K432 Incisional hernia without obstruction or gangrene: Secondary | ICD-10-CM | POA: Insufficient documentation

## 2020-12-07 DIAGNOSIS — Z87891 Personal history of nicotine dependence: Secondary | ICD-10-CM | POA: Diagnosis not present

## 2020-12-07 DIAGNOSIS — I10 Essential (primary) hypertension: Secondary | ICD-10-CM | POA: Insufficient documentation

## 2020-12-07 DIAGNOSIS — Z79899 Other long term (current) drug therapy: Secondary | ICD-10-CM | POA: Diagnosis not present

## 2020-12-07 DIAGNOSIS — Z01812 Encounter for preprocedural laboratory examination: Secondary | ICD-10-CM | POA: Diagnosis not present

## 2020-12-07 DIAGNOSIS — Z7982 Long term (current) use of aspirin: Secondary | ICD-10-CM | POA: Insufficient documentation

## 2020-12-07 HISTORY — DX: Pneumonia, unspecified organism: J18.9

## 2020-12-07 HISTORY — DX: Unspecified asthma, uncomplicated: J45.909

## 2020-12-07 LAB — CBC
HCT: 45.9 % (ref 36.0–46.0)
Hemoglobin: 14.7 g/dL (ref 12.0–15.0)
MCH: 28.1 pg (ref 26.0–34.0)
MCHC: 32 g/dL (ref 30.0–36.0)
MCV: 87.6 fL (ref 80.0–100.0)
Platelets: 281 10*3/uL (ref 150–400)
RBC: 5.24 MIL/uL — ABNORMAL HIGH (ref 3.87–5.11)
RDW: 14.2 % (ref 11.5–15.5)
WBC: 11.6 10*3/uL — ABNORMAL HIGH (ref 4.0–10.5)
nRBC: 0 % (ref 0.0–0.2)

## 2020-12-07 LAB — BASIC METABOLIC PANEL
Anion gap: 9 (ref 5–15)
BUN: 12 mg/dL (ref 8–23)
CO2: 28 mmol/L (ref 22–32)
Calcium: 9.7 mg/dL (ref 8.9–10.3)
Chloride: 103 mmol/L (ref 98–111)
Creatinine, Ser: 0.83 mg/dL (ref 0.44–1.00)
GFR, Estimated: >60 mL/min (ref >60)
Glucose, Bld: 98 mg/dL (ref 70–99)
Potassium: 3.9 mmol/L (ref 3.5–5.1)
Sodium: 140 mmol/L (ref 135–145)

## 2020-12-07 NOTE — Progress Notes (Signed)
PCP - Dr. Joycelyn Rua Cardiologist - Dr. Bryan Lemma Pulmonologist: Dr. Sandrea Hughs  PPM/ICD - Denies  Chest x-ray - N/A EKG - 10/21/20 Stress Test - Denies ECHO - 10/12/20 Cardiac Cath - Denies  Sleep Study - Denies  Patient denies having diabetes.  Blood Thinner Instructions: N/A Aspirin Instructions: Patient told to follow surgeons instructions on when to stop ASA.  ERAS Protcol - Yes PRE-SURGERY Ensure or G2- No  COVID TEST- 10/09/20   Anesthesia review: Yes, cardiac hx; cardiac clearance in Epic 10/21/20  Patient denies shortness of breath, fever, cough and chest pain at PAT appointment   All instructions explained to the patient, with a verbal understanding of the material. Patient agrees to go over the instructions while at home for a better understanding. Patient also instructed to self quarantine after being tested for COVID-19. The opportunity to ask questions was provided.

## 2020-12-08 NOTE — Anesthesia Preprocedure Evaluation (Addendum)
Anesthesia Evaluation  Patient identified by MRN, date of birth, ID band Patient awake    Reviewed: Allergy & Precautions, NPO status , Patient's Chart, lab work & pertinent test results  History of Anesthesia Complications Negative for: history of anesthetic complications  Airway Mallampati: I  TM Distance: >3 FB Neck ROM: Full    Dental  (+) Dental Advisory Given, Caps   Pulmonary former smoker,  12/09/2020 SARS coronavirus NEG   breath sounds clear to auscultation       Cardiovascular hypertension, Pt. on medications (-) angina+ Peripheral Vascular Disease (thoracic aortic aneurysm 3.9 cm)   Rhythm:Regular Rate:Normal  10/2020 ECHO: EF 55-60%, mild MR   Neuro/Psych negative neurological ROS     GI/Hepatic Neg liver ROS, GERD  Medicated and Controlled,  Endo/Other  negative endocrine ROS  Renal/GU negative Renal ROS     Musculoskeletal   Abdominal   Peds  Hematology negative hematology ROS (+)   Anesthesia Other Findings   Reproductive/Obstetrics                           Anesthesia Physical Anesthesia Plan  ASA: II  Anesthesia Plan: General   Post-op Pain Management:    Induction: Intravenous  PONV Risk Score and Plan: 3 and Ondansetron, Dexamethasone and Scopolamine patch - Pre-op  Airway Management Planned: Oral ETT  Additional Equipment: None  Intra-op Plan:   Post-operative Plan: Extubation in OR  Informed Consent: I have reviewed the patients History and Physical, chart, labs and discussed the procedure including the risks, benefits and alternatives for the proposed anesthesia with the patient or authorized representative who has indicated his/her understanding and acceptance.     Dental advisory given  Plan Discussed with: CRNA and Surgeon  Anesthesia Plan Comments: (PAT note written 12/08/2020 by Shonna Chock, PA-C. )      Anesthesia Quick Evaluation

## 2020-12-08 NOTE — Progress Notes (Signed)
Anesthesia Chart Review:  Case: [id number] Date/Time: 12/13/20 1145   Procedure: ROBOTIC INCISIONAL HERNIA REPAIR WITH MESH (N/A )   Anesthesia type: General   Pre-op diagnosis: INCISIONAL HERNIA   Location: [facility] ROOM 10 / [facility]   Surgeons: Axel Filler, MD      DISCUSSION: Patient is a 70 year old female scheduled for the above procedure.  History includes former smoker (quit 12/03/73), HTN, dyslipidemia, ascending thoracic aortic aneurysm (3.9 cm 10/20/19 CTA & by 10/12/20 echo), GERD, asthma, colon resection (5366), right THA (10/28/18).  Preoperative cardiology input at 10/21/20 visit with Dr. Herbie Baltimore, "Due for possible Hernia Sgx soon -- NO NEED for any additional Cardiac Evaluation.  LOW RISK patient for LOW RISK surgery." CTA of chest recommended ~ 10/2021.  Preoperative COVID-19 testing scheduled for 12/09/2020.  Anesthesia team to evaluate on the day of surgery.   VS: BP 136/79   Pulse 77   Temp 36.4 C (Oral)   Resp 18   Ht 5\' 9"  (1.753 m)   Wt 88.5 kg   SpO2 95%   BMI 28.80 kg/m    PROVIDERS: , MD is PCP  Joycelyn Rua, MD is cardiologist Bryan Lemma, MD is pulmonologist. Last visit 10/12/17.   LABS: Labs reviewed: Acceptable for surgery. (all labs ordered are listed, but only abnormal results are displayed)  Labs Reviewed  CBC - Abnormal; Notable for the following components:      Result Value   WBC 11.6 (*)    RBC 5.24 (*)    All other components within normal limits  BASIC METABOLIC PANEL   PFTs > 79 years old.   IMAGES: CT abd/pelvis 08/24/20: IMPRESSION: 1. Left lower quadrant ventral abdominal wall defect with herniation of abdominal fat is identified status post prior herniorrhaphy. 2. Postsurgical changes at the level of the proximal sigmoid colon compatible with previous colon resection and reanastomosis. There is retained stool identified at the level of the anastomosis with decreased caliber change distal to the  anastomosis. Findings suggestive of nonobstructing anastomotic stricture. - Aortic Atherosclerosis (ICD10-I70.0).  CTA Chest 10/20/19: IMPRESSION: Ascending aorta is relatively unchanged over time, with maximum estimated diameter of 3.9 cm.   EKG: 10/21/20: NSR   CV: Echo 10/12/20: IMPRESSIONS  1. Normal GLS -17.5. Left ventricular ejection fraction, by estimation,  is 55 to 60%. The left ventricle has normal function. The left ventricle  has no regional wall motion abnormalities. Left ventricular diastolic  parameters were normal.  2. Right ventricular systolic function is normal. The right ventricular  size is normal. There is normal pulmonary artery systolic pressure.  3. Left atrial size was mildly dilated.  4. The mitral valve is normal in structure. Mild mitral valve  regurgitation. No evidence of mitral stenosis.  5. The aortic valve is normal in structure. Aortic valve regurgitation is  not visualized. No aortic stenosis is present.  6. Aortic dilatation noted. There is mild dilatation of the ascending  aorta, measuring 39 mm.  7. The inferior vena cava is normal in size with greater than 50%  respiratory variability, suggesting right atrial pressure of 3 mmHg.  (Comparison echo 10/08/19: "EF 60 to 65%. Listed is GRII DD however left atrial size is normal (does not correlate), right atrium normal size. Aortic valve cannot exclude bicuspid, but no aortic stenosis or sclerosis. Mild dilation of ascending aorta-4 mm")   Past Medical History:  Diagnosis Date  . Arthritis   . Asthma   . Complication of anesthesia    patient  report she had a saddle block during c section and was aware during surgery that was being " cut on"   . GERD (gastroesophageal reflux disease)   . Hypertension   . Pneumonia   . Thoracic ascending aortic aneurysm (HCC) 09/2015   Stable:CTA Chest 10/20/2019: Ascending thoracic aorta greatest diameter 39 mm (compared to 2016 - 37 mm no  significant change)..    Past Surgical History:  Procedure Laterality Date  . APPENDECTOMY  2012  . bladder disection    . CESAREAN SECTION     x 2  . Chest CTA  10/2019   CTA Chest 10/20/2019: Ascending thoracic aorta greatest diameter 39 mm (compared to 2016 - 37 mm no significant change)..  . COLON RESECTION  1982   1982  . DILATION AND CURETTAGE, DIAGNOSTIC / THERAPEUTIC    . HERNIA REPAIR     mesh in place   . TONSILLECTOMY    . 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 TRANSTHORACIC ECHOCARDIOGRAM  10/2019   EF 60 to 65%.  Listed is GRII DD however left atrial size is normal (does not correlate), right atrium normal size.  Aortic valve cannot exclude bicuspid, but no aortic stenosis or sclerosis.  Mild dilation of ascending aorta-4 mm.     MEDICATIONS: . Ascorbic Acid (VITAMIN C PO)  . aspirin EC 81 MG tablet  . aspirin-acetaminophen-caffeine (EXCEDRIN MIGRAINE) 250-250-65 MG tablet  . b complex vitamins tablet  . CALCIUM-MAGNESIUM PO  . Cholecalciferol (VITAMIN D) 50 MCG (2000 UT) tablet  . famotidine-calcium carbonate-magnesium hydroxide (PEPCID COMPLETE) 10-800-165 MG chewable tablet  . FIBER DIET TABS  . hydrochlorothiazide (HYDRODIURIL) 25 MG tablet  . LECITHIN PO  . loratadine (CLARITIN) 10 MG tablet  . Misc Natural Products (GLUCOSAMINE CHONDROITIN TRIPLE) TABS  . Multiple Vitamin (MULTIVITAMIN) capsule  . omeprazole (PRILOSEC) 20 MG capsule  . Probiotic Product (PROBIOTIC PO)  . RESTASIS 0.05 % ophthalmic emulsion  . rosuvastatin (CRESTOR) 10 MG tablet   No current facility-administered medications for this encounter.    Shonna Chock, PA-C Surgical Short Stay/Anesthesiology [facility] Phone [phone] [facility] Phone [phone] 12/08/2020 11:54 AM

## 2020-12-09 ENCOUNTER — Other Ambulatory Visit (HOSPITAL_COMMUNITY)
Admission: RE | Admit: 2020-12-09 | Discharge: 2020-12-09 | Disposition: A | Discharge status: Home / Self Care | Payer: Medicare Other | Source: Ambulatory Visit | Attending: General Surgery | Admitting: General Surgery

## 2020-12-09 DIAGNOSIS — Z01812 Encounter for preprocedural laboratory examination: Secondary | ICD-10-CM | POA: Insufficient documentation

## 2020-12-09 DIAGNOSIS — Z20822 Contact with and (suspected) exposure to covid-19: Secondary | ICD-10-CM | POA: Diagnosis not present

## 2020-12-09 LAB — SARS CORONAVIRUS 2 (TAT 6-24 HRS): SARS Coronavirus 2: NEGATIVE

## 2020-12-13 ENCOUNTER — Encounter (HOSPITAL_COMMUNITY)
Admission: RE | Disposition: A | Discharge status: Home / Self Care | Payer: Self-pay | Source: Home / Self Care | Attending: General Surgery

## 2020-12-13 ENCOUNTER — Ambulatory Visit (HOSPITAL_COMMUNITY): Payer: Medicare Other | Admitting: Anesthesiology

## 2020-12-13 ENCOUNTER — Other Ambulatory Visit: Payer: Self-pay

## 2020-12-13 ENCOUNTER — Ambulatory Visit (HOSPITAL_COMMUNITY)
Admission: RE | Admit: 2020-12-13 | Discharge: 2020-12-13 | Disposition: A | Discharge status: Home / Self Care | Payer: Medicare Other | Attending: General Surgery | Admitting: General Surgery

## 2020-12-13 ENCOUNTER — Ambulatory Visit (HOSPITAL_COMMUNITY): Payer: Medicare Other | Admitting: Vascular Surgery

## 2020-12-13 ENCOUNTER — Encounter (HOSPITAL_COMMUNITY): Payer: Self-pay | Admitting: General Surgery

## 2020-12-13 DIAGNOSIS — K432 Incisional hernia without obstruction or gangrene: Secondary | ICD-10-CM | POA: Diagnosis not present

## 2020-12-13 DIAGNOSIS — J45909 Unspecified asthma, uncomplicated: Secondary | ICD-10-CM | POA: Diagnosis not present

## 2020-12-13 DIAGNOSIS — Z9109 Other allergy status, other than to drugs and biological substances: Secondary | ICD-10-CM | POA: Diagnosis not present

## 2020-12-13 DIAGNOSIS — I10 Essential (primary) hypertension: Secondary | ICD-10-CM | POA: Diagnosis not present

## 2020-12-13 DIAGNOSIS — K430 Incisional hernia with obstruction, without gangrene: Secondary | ICD-10-CM | POA: Diagnosis not present

## 2020-12-13 DIAGNOSIS — K219 Gastro-esophageal reflux disease without esophagitis: Secondary | ICD-10-CM | POA: Diagnosis not present

## 2020-12-13 DIAGNOSIS — Z87891 Personal history of nicotine dependence: Secondary | ICD-10-CM | POA: Diagnosis not present

## 2020-12-13 DIAGNOSIS — E785 Hyperlipidemia, unspecified: Secondary | ICD-10-CM | POA: Diagnosis not present

## 2020-12-13 HISTORY — PX: XI ROBOTIC ASSISTED VENTRAL HERNIA: SHX6789

## 2020-12-13 SURGERY — REPAIR, HERNIA, VENTRAL, ROBOT-ASSISTED
Anesthesia: General

## 2020-12-13 MED ORDER — CHLORHEXIDINE GLUCONATE CLOTH 2 % EX PADS: 6.0000 | MEDICATED_PAD | Freq: Once | CUTANEOUS | Status: DC

## 2020-12-13 MED ORDER — FENTANYL CITRATE (PF) 250 MCG/5ML IJ SOLN
INTRAMUSCULAR | Status: DC | PRN
  Administered 2020-12-13 (×2): 50 ug via INTRAVENOUS
  Administered 2020-12-13: 100 ug via INTRAVENOUS
  Administered 2020-12-13: 50 ug via INTRAVENOUS

## 2020-12-13 MED ORDER — MEPERIDINE HCL 25 MG/ML IJ SOLN: 6.2500 mg | INTRAMUSCULAR | Status: DC | PRN

## 2020-12-13 MED ORDER — SODIUM CHLORIDE 0.9 % IV SOLN
INTRAVENOUS | Status: DC | PRN
  Administered 2020-12-13: 40 mL

## 2020-12-13 MED ORDER — ORAL CARE MOUTH RINSE: 15.0000 mL | Freq: Once | OROMUCOSAL | Status: AC

## 2020-12-13 MED ORDER — CEFAZOLIN SODIUM-DEXTROSE 2-4 GM/100ML-% IV SOLN
2.0000 g | INTRAVENOUS | Status: AC
  Administered 2020-12-13: 2 g via INTRAVENOUS

## 2020-12-13 MED ORDER — PROPOFOL 10 MG/ML IV BOLUS
INTRAVENOUS | Status: DC | PRN
Start: 2020-12-13 — End: 2020-12-13
  Administered 2020-12-13: 120 mg via INTRAVENOUS
  Administered 2020-12-13: 20 mg via INTRAVENOUS

## 2020-12-13 MED ORDER — SCOPOLAMINE 1 MG/3DAYS TD PT72
MEDICATED_PATCH | TRANSDERMAL | Status: AC
  Administered 2020-12-13: 1.5 mg via TRANSDERMAL
  Filled 2020-12-13: qty 1

## 2020-12-13 MED ORDER — SODIUM CHLORIDE 0.9 % IV SOLN: INTRAVENOUS | Status: DC | PRN

## 2020-12-13 MED ORDER — ACETAMINOPHEN 500 MG PO TABS
ORAL_TABLET | ORAL | Status: AC
  Administered 2020-12-13: 1000 mg via ORAL
  Filled 2020-12-13: qty 2

## 2020-12-13 MED ORDER — LACTATED RINGERS IV SOLN: INTRAVENOUS | Status: DC | PRN

## 2020-12-13 MED ORDER — SUGAMMADEX SODIUM 200 MG/2ML IV SOLN
INTRAVENOUS | Status: DC | PRN
  Administered 2020-12-13: 200 mg via INTRAVENOUS

## 2020-12-13 MED ORDER — ROCURONIUM BROMIDE 10 MG/ML (PF) SYRINGE
PREFILLED_SYRINGE | INTRAVENOUS | Status: DC | PRN
  Administered 2020-12-13: 60 mg via INTRAVENOUS

## 2020-12-13 MED ORDER — DEXAMETHASONE SODIUM PHOSPHATE 10 MG/ML IJ SOLN
INTRAMUSCULAR | Status: DC | PRN
  Administered 2020-12-13: 5 mg via INTRAVENOUS

## 2020-12-13 MED ORDER — KETOROLAC TROMETHAMINE 30 MG/ML IJ SOLN
INTRAMUSCULAR | Status: DC | PRN
Start: 2020-12-13 — End: 2020-12-13
  Administered 2020-12-13: 30 mg via INTRAVENOUS

## 2020-12-13 MED ORDER — CHLORHEXIDINE GLUCONATE 0.12 % MT SOLN: 15.0000 mL | Freq: Once | OROMUCOSAL | Status: AC

## 2020-12-13 MED ORDER — TRAMADOL HCL 50 MG PO TABS: 50.0000 mg | ORAL_TABLET | Freq: Four times a day (QID) | ORAL | 0 refills | Status: AC | PRN

## 2020-12-13 MED ORDER — CHLORHEXIDINE GLUCONATE 0.12 % MT SOLN
OROMUCOSAL | Status: AC
  Administered 2020-12-13: 15 mL via OROMUCOSAL
  Filled 2020-12-13: qty 15

## 2020-12-13 MED ORDER — VISTASEAL 10 ML SINGLE DOSE KIT
10.0000 mL | PACK | Freq: Once | CUTANEOUS | Status: DC
  Filled 2020-12-13: qty 10

## 2020-12-13 MED ORDER — HYDROMORPHONE HCL 1 MG/ML IJ SOLN
0.2500 mg | INTRAMUSCULAR | Status: DC | PRN
  Administered 2020-12-13 (×2): 0.25 mg via INTRAVENOUS

## 2020-12-13 MED ORDER — SCOPOLAMINE 1 MG/3DAYS TD PT72: 1.0000 | MEDICATED_PATCH | TRANSDERMAL | Status: DC

## 2020-12-13 MED ORDER — MIDAZOLAM HCL 2 MG/2ML IJ SOLN: 0.5000 mg | Freq: Once | INTRAMUSCULAR | Status: DC | PRN

## 2020-12-13 MED ORDER — CEFAZOLIN SODIUM-DEXTROSE 2-4 GM/100ML-% IV SOLN
INTRAVENOUS | Status: AC
  Filled 2020-12-13: qty 100

## 2020-12-13 MED ORDER — MIDAZOLAM HCL 5 MG/5ML IJ SOLN
INTRAMUSCULAR | Status: DC | PRN
  Administered 2020-12-13: 2 mg via INTRAVENOUS

## 2020-12-13 MED ORDER — ACETAMINOPHEN 500 MG PO TABS: 1000.0000 mg | ORAL_TABLET | ORAL | Status: AC

## 2020-12-13 MED ORDER — BUPIVACAINE HCL (PF) 0.25 % IJ SOLN
INTRAMUSCULAR | Status: AC
  Filled 2020-12-13: qty 30

## 2020-12-13 MED ORDER — OXYCODONE HCL 5 MG/5ML PO SOLN
ORAL | Status: AC
  Administered 2020-12-13: 5 mg via ORAL
  Filled 2020-12-13: qty 5

## 2020-12-13 MED ORDER — STERILE WATER FOR IRRIGATION IR SOLN
Status: DC | PRN
  Administered 2020-12-13: 1000 mL

## 2020-12-13 MED ORDER — FENTANYL CITRATE (PF) 250 MCG/5ML IJ SOLN
INTRAMUSCULAR | Status: AC
  Filled 2020-12-13: qty 5

## 2020-12-13 MED ORDER — OXYCODONE HCL 5 MG/5ML PO SOLN: 5.0000 mg | Freq: Once | ORAL | Status: AC | PRN

## 2020-12-13 MED ORDER — MIDAZOLAM HCL 2 MG/2ML IJ SOLN
INTRAMUSCULAR | Status: AC
  Filled 2020-12-13: qty 2

## 2020-12-13 MED ORDER — PROMETHAZINE HCL 25 MG/ML IJ SOLN
6.2500 mg | INTRAMUSCULAR | Status: DC | PRN
Start: 2020-12-13 — End: 2020-12-13

## 2020-12-13 MED ORDER — OXYCODONE HCL 5 MG PO TABS: 5.0000 mg | ORAL_TABLET | Freq: Once | ORAL | Status: AC | PRN

## 2020-12-13 MED ORDER — BUPIVACAINE HCL 0.25 % IJ SOLN
INTRAMUSCULAR | Status: DC | PRN
  Administered 2020-12-13: 8 mL

## 2020-12-13 MED ORDER — LACTATED RINGERS IV SOLN: INTRAVENOUS | Status: DC

## 2020-12-13 MED ORDER — 0.9 % SODIUM CHLORIDE (POUR BTL) OPTIME
TOPICAL | Status: DC | PRN
  Administered 2020-12-13: 1000 mL

## 2020-12-13 MED ORDER — ONDANSETRON HCL 4 MG/2ML IJ SOLN
INTRAMUSCULAR | Status: DC | PRN
  Administered 2020-12-13: 4 mg via INTRAVENOUS

## 2020-12-13 MED ORDER — PROPOFOL 10 MG/ML IV BOLUS
INTRAVENOUS | Status: AC
  Filled 2020-12-13: qty 40

## 2020-12-13 MED ORDER — BUPIVACAINE LIPOSOME 1.3 % IJ SUSP
20.0000 mL | Freq: Once | INTRAMUSCULAR | Status: DC
  Filled 2020-12-13: qty 20

## 2020-12-13 MED ORDER — HYDROMORPHONE HCL 1 MG/ML IJ SOLN
INTRAMUSCULAR | Status: AC
  Administered 2020-12-13: 0.5 mg via INTRAVENOUS
  Filled 2020-12-13: qty 1

## 2020-12-13 SURGICAL SUPPLY — 60 items
APPLICATOR VISTASEAL 35 (MISCELLANEOUS) IMPLANT
BINDER ABDOMINAL 12 ML 46-62 (SOFTGOODS) ×2 IMPLANT
CANNULA REDUC XI 12-8 STAPL (CANNULA) ×2
CANNULA REDUCER 12-8 DVNC XI (CANNULA) ×1 IMPLANT
CHLORAPREP W/TINT 26 (MISCELLANEOUS) ×2 IMPLANT
COVER MAYO STAND STRL (DRAPES) ×2 IMPLANT
COVER SURGICAL LIGHT HANDLE (MISCELLANEOUS) ×2 IMPLANT
COVER TIP SHEARS 8 DVNC (MISCELLANEOUS) ×1 IMPLANT
COVER TIP SHEARS 8MM DA VINCI (MISCELLANEOUS) ×2
COVER WAND RF STERILE (DRAPES) IMPLANT
DECANTER SPIKE VIAL GLASS SM (MISCELLANEOUS) ×2 IMPLANT
DEFOGGER SCOPE WARMER CLEARIFY (MISCELLANEOUS) ×2 IMPLANT
DERMABOND ADVANCED (GAUZE/BANDAGES/DRESSINGS) ×1
DERMABOND ADVANCED .7 DNX12 (GAUZE/BANDAGES/DRESSINGS) ×1 IMPLANT
DEVICE SECURE STRAP 25 ABSORB (INSTRUMENTS) IMPLANT
DEVICE TROCAR PUNCTURE CLOSURE (ENDOMECHANICALS) IMPLANT
DRAPE ARM DVNC X/XI (DISPOSABLE) ×4 IMPLANT
DRAPE CARDIOVASCULAR INCISE (DRAPES) ×2
DRAPE COLUMN DVNC XI (DISPOSABLE) ×1 IMPLANT
DRAPE CV SPLIT W-CLR ANES SCRN (DRAPES) IMPLANT
DRAPE DA VINCI XI ARM (DISPOSABLE) ×8
DRAPE DA VINCI XI COLUMN (DISPOSABLE) ×2
DRAPE ORTHO SPLIT 77X108 STRL (DRAPES)
DRAPE SRG 135X102X78XABS (DRAPES) ×1 IMPLANT
DRAPE SURG ORHT 6 SPLT 77X108 (DRAPES) IMPLANT
GLOVE BIO SURGEON STRL SZ7.5 (GLOVE) ×4 IMPLANT
GOWN STRL REUS W/ TWL LRG LVL3 (GOWN DISPOSABLE) ×2 IMPLANT
GOWN STRL REUS W/ TWL XL LVL3 (GOWN DISPOSABLE) ×2 IMPLANT
GOWN STRL REUS W/TWL 2XL LVL3 (GOWN DISPOSABLE) ×2 IMPLANT
GOWN STRL REUS W/TWL LRG LVL3 (GOWN DISPOSABLE) ×4
GOWN STRL REUS W/TWL XL LVL3 (GOWN DISPOSABLE) ×4
KIT BASIN OR (CUSTOM PROCEDURE TRAY) ×2 IMPLANT
KIT TURNOVER KIT B (KITS) ×2 IMPLANT
MARKER SKIN DUAL TIP RULER LAB (MISCELLANEOUS) ×2 IMPLANT
MESH HERNIA 6X6 BARD (Mesh General) ×1 IMPLANT
MESH HERNIA BARD 6X6 (Mesh General) ×1 IMPLANT
NEEDLE HYPO 22GX1.5 SAFETY (NEEDLE) ×2 IMPLANT
OBTURATOR OPTICAL STANDARD 8MM (TROCAR)
OBTURATOR OPTICAL STND 8 DVNC (TROCAR)
OBTURATOR OPTICALSTD 8 DVNC (TROCAR) IMPLANT
PAD ARMBOARD 7.5X6 YLW CONV (MISCELLANEOUS) ×4 IMPLANT
PENCIL SMOKE EVACUATOR (MISCELLANEOUS) IMPLANT
SCISSORS LAP 5X35 DISP (ENDOMECHANICALS) ×4 IMPLANT
SEAL CANN UNIV 5-8 DVNC XI (MISCELLANEOUS) ×3 IMPLANT
SEAL XI 5MM-8MM UNIVERSAL (MISCELLANEOUS) ×6
SET IRRIG TUBING LAPAROSCOPIC (IRRIGATION / IRRIGATOR) IMPLANT
SET TUBE SMOKE EVAC HIGH FLOW (TUBING) ×2 IMPLANT
STAPLER CANNULA SEAL DVNC XI (STAPLE) ×1 IMPLANT
STAPLER CANNULA SEAL XI (STAPLE) ×2
STOPCOCK 4 WAY LG BORE MALE ST (IV SETS) ×2 IMPLANT
SUT DVC VLOC 180 2-0 12IN GS21 (SUTURE) ×4
SUT MNCRL AB 4-0 PS2 18 (SUTURE) ×2 IMPLANT
SUT VLOC 180 0 6IN GS21 (SUTURE) ×2 IMPLANT
SUT VLOC 180 0 9IN  GS21 (SUTURE) ×2
SUT VLOC 180 0 9IN GS21 (SUTURE) ×1 IMPLANT
SUT VLOC 180 2-0 9IN GS21 (SUTURE) IMPLANT
SUTURE DVC VL 180 2-0 12INGS21 (SUTURE) ×2 IMPLANT
TOWEL GREEN STERILE FF (TOWEL DISPOSABLE) ×2 IMPLANT
TRAY LAPAROSCOPIC MC (CUSTOM PROCEDURE TRAY) ×2 IMPLANT
TROCAR XCEL NON-BLD 5MMX100MML (ENDOMECHANICALS) IMPLANT

## 2020-12-13 NOTE — Op Note (Signed)
12/13/2020  9:20 AM  PATIENT:  [name]  70 y.o. female  PRE-OPERATIVE DIAGNOSIS:  INCISIONAL HERNIA  POST-OPERATIVE DIAGNOSIS:  INCISIONAL HERNIA  PROCEDURE:  Procedure(s): ROBOTIC INCISIONAL HERNIA REPAIR WITH MESH, L sided musculocutaneous muscle advacement (N/A)  SURGEON:  Surgeon(s) and Role:    Axel Filler, MD - Primary   ASSISTANTS: Myrtie Soman, RNFA   ANESTHESIA:   local and general  EBL:  minimal   BLOOD ADMINISTERED:none  DRAINS: none   LOCAL MEDICATIONS USED:  BUPIVICAINE  and OTHER exparil  SPECIMEN:  No Specimen  DISPOSITION OF SPECIMEN:  N/A  COUNTS:  YES  TOURNIQUET:  * No tourniquets in log *  DICTATION: .Dragon Dictation Details of the procedure:   The patient was taken back to the operating room. The patient was placed in supine position with bilateral SCDs in place.The patient was prepped and draped in the usual sterile fashion.  After appropriate anitbiotics were confirmed, a time-out was confirmed and all facts were verified.  At this time a Veress needle technique was used to insufflate the abdomen at the right subcostal margin . This time a 8 mm robotic trocar was placed into the abdomen. The camera was placed there was no injury to any intra-abdominal organs. A 57mm umbilical port was placed in the epigastrum and right lower quadrant.  Robot was positioned over the patient and the ports were docked in the usual fashion.  The omentum was taken down from the hernia in the LLQ.  The hernia contents consisted of omentum.  This was fully reduced and this revealed two hernias in the LLQ.  At this time the left -sided peritoneum was taken down from the left rectus sheath.  Dissection was taken to the hernia.  The left transversalis fascia was taken down and toward the hernia itself.  This was down from a bottom up technique.  This allowed me to get approximately 7cm lateral to the hernia edge.  I continued the dissection superiorly.   And this was done for approximately 4cm.  At this time the hernia defect was closed used running 0 vlock suture.  This closed the hernia primary and advanced the muscle medially.  This preperitoneal areas was measured and was apprioximately 15 x15cm.  A piece of Bard polypropylene was cut to shape and placed into the preperitoneal area.  This lay flat and overlapped the hernia closure by 5+ cm.  At this time the retrorectus and peritoneum was closed with a running 2-0 vock x 2.  The hernia defect that was cut was close individually.  Omentum was readily over the hernia repair site.  At this time the robot was undocked. The umbilical port site was reapproximated using a 0 Vicryl via an Endo Close device 1. All ports were removed. The skin was reapproximated and all port sites using 4-0 Monocryl subcuticular fashion.  The patient the procedure well was taken to the recovery     PLAN OF CARE: Discharge to home after PACU  PATIENT DISPOSITION:  PACU - hemodynamically stable.   Delay start of Pharmacological VTE agent (>24hrs) due to surgical blood loss or risk of bleeding: not applicable

## 2020-12-13 NOTE — Discharge Instructions (Signed)
[facility] _______[facility] ° °HERNIA REPAIR: POST OP INSTRUCTIONS ° °Always review your discharge instruction sheet given to you by the facility where your surgery was performed. °IF YOU HAVE DISABILITY OR FAMILY LEAVE FORMS, YOU MUST BRING THEM TO THE OFFICE FOR PROCESSING.   °DO NOT GIVE THEM TO YOUR DOCTOR. ° °1. A  prescription for pain medication may be given to you upon discharge.  Take your pain medication as prescribed, if needed.  If narcotic pain medicine is not needed, then you may take acetaminophen (Tylenol) or ibuprofen (Advil) as needed. °2. Take your usually prescribed medications unless otherwise directed. °If you need a refill on your pain medication, please contact your pharmacy.  They will contact our office to request authorization. Prescriptions will not be filled after 5 pm or on week-ends. °3. You should follow a light diet the first 24 hours after arrival home, such as soup and crackers, etc.  Be sure to include lots of fluids daily.  Resume your normal diet the day after surgery. °4.Most patients will experience some swelling and bruising around the umbilicus or in the groin and scrotum.  Ice packs and reclining will help.  Swelling and bruising can take several days to resolve.  °6. It is common to experience some constipation if taking pain medication after surgery.  Increasing fluid intake and taking a stool softener (such as Colace) will usually help or prevent this problem from occurring.  A mild laxative (Milk of Magnesia or Miralax) should be taken according to package directions if there are no bowel movements after 48 hours. °7. Unless discharge instructions indicate otherwise, you may remove your bandages 24-48 hours after surgery, and you may shower at that time.  You may have steri-strips (small skin tapes) in place directly over the incision.  These strips should be left on the skin for 7-10 days.  If your surgeon used skin glue on the incision, you may shower in  24 hours.  The glue will flake off over the next 2-3 weeks.  Any sutures or staples will be removed at the office during your follow-up visit. °8. ACTIVITIES:  You may resume regular (light) daily activities beginning the next day--such as daily self-care, walking, climbing stairs--gradually increasing activities as tolerated.  You may have sexual intercourse when it is comfortable.  Refrain from any heavy lifting or straining until approved by your doctor. ° °a.You may drive when you are no longer taking prescription pain medication, you can comfortably wear a seatbelt, and you can safely maneuver your car and apply brakes. °b.RETURN TO WORK:   °_____________________________________________ ° °9.You should see your doctor in the office for a follow-up appointment approximately 2-3 weeks after your surgery.  Make sure that you call for this appointment within a day or two after you arrive home to insure a convenient appointment time. °10.OTHER INSTRUCTIONS: _________________________ °   _____________________________________ ° °WHEN TO CALL YOUR DOCTOR: °1. Fever over 101.0 °2. Inability to urinate °3. Nausea and/or vomiting °4. Extreme swelling or bruising °5. Continued bleeding from incision. °6. Increased pain, redness, or drainage from the incision ° °The clinic staff is available to answer your questions during regular business hours.  Please don’t hesitate to call and ask to speak to one of the nurses for clinical concerns.  If you have a medical emergency, go to the nearest emergency room or call 911.  A surgeon from [facility] is always on call at the hospital ° ° °[address], [address], [place], [place]  [address] ? ° [address] [address], [place], [place]   [address] °[phone] ? [phone] ? FAX [phone] °Web site: [email or web address] ° °

## 2020-12-13 NOTE — Anesthesia Procedure Notes (Signed)
Procedure Name: Intubation Date/Time: 12/13/2020 7:39 AM Performed by: Glynda Jaeger, CRNA Pre-anesthesia Checklist: Patient identified, Patient being monitored, Timeout performed, Emergency Drugs available and Suction available Patient Re-evaluated:Patient Re-evaluated prior to induction Oxygen Delivery Method: Circle System Utilized Preoxygenation: Pre-oxygenation with 100% oxygen Induction Type: IV induction Ventilation: Mask ventilation without difficulty Laryngoscope Size: Mac and 4 Grade View: Grade I Tube type: Oral Tube size: 7.5 mm Number of attempts: 1 Airway Equipment and Method: Stylet Placement Confirmation: ETT inserted through vocal cords under direct vision,  positive ETCO2 and breath sounds checked- equal and bilateral Secured at: 21 cm Tube secured with: Tape Dental Injury: Teeth and Oropharynx as per pre-operative assessment

## 2020-12-13 NOTE — Transfer of Care (Signed)
Immediate Anesthesia Transfer of Care Note  Patient: [name]  Procedure(s) Performed: ROBOTIC INCISIONAL HERNIA REPAIR WITH MESH (N/A )  Patient Location: PACU  Anesthesia Type:General  Level of Consciousness: awake, alert , patient cooperative and responds to stimulation  Airway & Oxygen Therapy: Patient connected to face mask oxygen  Post-op Assessment: Report given to RN and Post -op Vital signs reviewed and stable  Post vital signs: Reviewed and stable  Last Vitals:  Vitals Value Taken Time  BP    Temp    Pulse    Resp    SpO2      Last Pain: There were no vitals filed for this visit.    Patients Stated Pain Goal: 3 (12/13/20 4818)  Complications: No complications documented.

## 2020-12-13 NOTE — Anesthesia Postprocedure Evaluation (Signed)
Anesthesia Post Note  Patient: [name]  Procedure(s) Performed: ROBOTIC INCISIONAL HERNIA REPAIR WITH MESH (N/A )     Patient location during evaluation: PACU Anesthesia Type: General Level of consciousness: awake and alert, patient cooperative and oriented Pain management: pain level controlled Vital Signs Assessment: post-procedure vital signs reviewed and stable Respiratory status: spontaneous breathing, nonlabored ventilation and respiratory function stable Cardiovascular status: blood pressure returned to baseline and stable Postop Assessment: no apparent nausea or vomiting Anesthetic complications: no   No complications documented.  Last Vitals:  Vitals:   12/13/20 1000 12/13/20 1010  BP: 136/70 126/70  Pulse: 68 60  Resp: 13 12  Temp:    SpO2: 95% 97%    Last Pain:  Vitals:   12/13/20 1010  PainSc: 4                  Arden Tinoco,E. Shakti Fleer

## 2020-12-13 NOTE — H&P (Signed)
History of Present Illness  The patient is a 70 year old female who presents with an incisional hernia. Referred by: Dr. Dulce Sellar Chief Complaint: Incisional hernia  Patient is a 70 year old female, who comes in secondary to an incisional hernia. Patient states that she had a C-section in 1982 with a bowel injury as well as bowel resection. Subsequent patient had a hernia that was seen unscented C-section 1987. Patient underwent incisional hernia repair with mesh. Patient states that recently she noticed that the hernias gotten larger. She status post bulge. She sees the bulge increased in size secondary to asthma exacerbations. Patient did undergo CT scan in September 21. This did show a incisional hernia in the left lower quadrant area. This appears to be area of the previous hernia repair. There appears to be metallic staples in place at the level of the fascia. There appears to be a herniated retroperitoneal fat back to be seen likely caused the bulge. Modifying factors:   Past Surgical History Appendectomy  Cesarean Section - Multiple  Hip Surgery  Right. Oral Surgery  Tonsillectomy   Diagnostic Studies History Colonoscopy  within last year Mammogram  within last year Pap Smear  1-5 years ago  Allergies Dulera *ANTIASTHMATIC AND BRONCHODILATOR AGENTS*  Allergies Reconciled   Medication History hydroCHLOROthiazide (25MG  Tablet, Oral) Active. Omeprazole (20MG  Capsule DR, Oral) Active. Flovent (44MCG/ACT Aerosol, Inhalation) Active. Medications Reconciled  Social History  Alcohol use  Occasional alcohol use. Caffeine use  Carbonated beverages, Coffee, Tea. No drug use  Tobacco use  Former smoker.  Family History  Arthritis  Mother. Bleeding disorder  Mother. Colon Polyps  Brother. Diabetes Mellitus  Brother. Hypertension  Brother, Father, Mother. Kidney Disease  Brother. Melanoma  Brother. Respiratory Condition   Brother. Thyroid problems  Mother.  Pregnancy / Birth History  Age at menarche  14 years. Age of menopause  62-55 Gravida  2 Length (months) of breastfeeding  7-12 Maternal age  25-30 Para  2  Other Problems Asthma  Bladder Problems  Gastroesophageal Reflux Disease  Hemorrhoids  High blood pressure  Vascular Disease     Review of Systems  General Not Present- Appetite Loss, Chills, Fatigue, Fever, Night Sweats, Weight Gain and Weight Loss. Skin Not Present- Change in Wart/Mole, Dryness, Hives, Jaundice, New Lesions, Non-Healing Wounds, Rash and Ulcer. HEENT Present- Hearing Loss and Wears glasses/contact lenses. Not Present- Earache, Hoarseness, Nose Bleed, Oral Ulcers, Ringing in the Ears, Seasonal Allergies, Sinus Pain, Sore Throat, Visual Disturbances and Yellow Eyes. Respiratory Present- Chronic Cough. Not Present- Bloody sputum, Difficulty Breathing, Snoring and Wheezing. Breast Not Present- Breast Mass, Breast Pain, Nipple Discharge and Skin Changes. Cardiovascular Not Present- Chest Pain, Difficulty Breathing Lying Down, Leg Cramps, Palpitations, Rapid Heart Rate, Shortness of Breath and Swelling of Extremities. Gastrointestinal Present- Change in Bowel Habits and Hemorrhoids. Not Present- Abdominal Pain, Bloating, Bloody Stool, Chronic diarrhea, Constipation, Difficulty Swallowing, Excessive gas, Gets full quickly at meals, Indigestion, Nausea, Rectal Pain and Vomiting. Female Genitourinary Not Present- Frequency, Nocturia, Painful Urination, Pelvic Pain and Urgency. Musculoskeletal Not Present- Back Pain, Joint Pain, Joint Stiffness, Muscle Pain, Muscle Weakness and Swelling of Extremities. Neurological Present- Numbness. Not Present- Decreased Memory, Fainting, Headaches, Seizures, Tingling, Tremor, Trouble walking and Weakness. Psychiatric Not Present- Anxiety, Bipolar, Change in Sleep Pattern, Depression, Fearful and Frequent crying. Endocrine Present-  Hot flashes. Not Present- Cold Intolerance, Excessive Hunger, Hair Changes, Heat Intolerance and New Diabetes. Hematology Not Present- Blood Thinners, Easy Bruising, Excessive bleeding, Gland problems, HIV and Persistent Infections. All  other systems negative  BP (!) 143/58   Pulse 69   Temp 98.2 F (36.8 C)   Ht 5\' 9"  (1.753 m)   Wt 88.5 kg   LMP  (LMP Unknown)   SpO2 95%   BMI 28.81 kg/m     Physical Exam  The physical exam findings are as follows: Note: Constitutional: No acute distress, conversant, appears stated age  Eyes: Anicteric sclerae, moist conjunctiva, no lid lag  Neck: No thyromegaly, trachea midline, no cervical lymphadenopathy  Lungs: Clear to auscultation biilaterally, normal respiratory effot  Cardiovascular: regular rate & rhythm, no murmurs, no peripheal edema, pedal pulses 2+  GI: Soft, no masses or hepatosplenomegaly, non-tender to palpation  MSK: Normal gait, no clubbing cyanosis, edema  Skin: No rashes, palpation reveals normal skin turgor  Psychiatric: Appropriate judgment and insight, oriented to person, place, and time  Abdomen Inspection Hernias - Incisional - Incarcerated (70 year old female with a left lower quadrant incisional hernia from her previous hernia repair with mesh.) .    Assessment & Plan INCISIONAL HERNIA, WITHOUT OBSTRUCTION OR GANGRENE (K43.2) Impression: Patient is a 70 year old female, comes in secondary to a left lower quadrant incisional hernia. This appears to be recurrent.  1. Will proceed to the operating room for robotic I's adhesions, incisional hernia repair with mesh. All risks and benefits were discussed with the patient to generally include, but not limited to: infection, bleeding, damage to surrounding structures, acute and chronic nerve pain, and recurrence. Alternatives were offered and described. All questions were answered and the patient voiced understanding of the procedure and wishes  to proceed at this point with hernia repair.  I reviewed the patient's external notes from the referring physicians as well as consulting physician team. Each of the radiologic studies and lab studies were independently reviewed and interpreted. I discussed the results of the above studies and how they relate to the patient's surgical problems.

## 2020-12-14 ENCOUNTER — Encounter (HOSPITAL_COMMUNITY): Payer: Self-pay | Admitting: General Surgery

## 2021-01-30 DIAGNOSIS — Z1231 Encounter for screening mammogram for malignant neoplasm of breast: Secondary | ICD-10-CM | POA: Diagnosis not present

## 2021-02-13 ENCOUNTER — Telehealth: Payer: Self-pay | Admitting: Internal Medicine

## 2021-02-13 NOTE — Telephone Encounter (Signed)
Called and spoke with pt and she has not been seen since 2018.  The medication that she is using( flovent) was from 2019.  She stated that she does not use this very often and needed a refill of this.  Consult scheduled with ND for 04/05/  Pt is aware.

## 2021-02-17 DIAGNOSIS — Z79899 Other long term (current) drug therapy: Secondary | ICD-10-CM | POA: Diagnosis not present

## 2021-02-17 DIAGNOSIS — E7800 Pure hypercholesterolemia, unspecified: Secondary | ICD-10-CM | POA: Diagnosis not present

## 2021-03-07 ENCOUNTER — Encounter: Payer: Self-pay | Admitting: Internal Medicine

## 2021-03-07 ENCOUNTER — Ambulatory Visit: Payer: Medicare Other | Admitting: Internal Medicine

## 2021-03-07 ENCOUNTER — Other Ambulatory Visit: Payer: Self-pay

## 2021-03-07 VITALS — BP 124/72 | HR 78 | Temp 98.0°F | Ht 69.0 in | Wt 196.8 lb

## 2021-03-07 DIAGNOSIS — J4520 Mild intermittent asthma, uncomplicated: Secondary | ICD-10-CM | POA: Diagnosis not present

## 2021-03-07 DIAGNOSIS — J310 Chronic rhinitis: Secondary | ICD-10-CM | POA: Diagnosis not present

## 2021-03-07 DIAGNOSIS — K219 Gastro-esophageal reflux disease without esophagitis: Secondary | ICD-10-CM

## 2021-03-07 MED ORDER — FLUTICASONE PROPIONATE 50 MCG/ACT NA SUSP: 1.0000 | Freq: Every day | NASAL | 3 refills | Status: DC

## 2021-03-07 MED ORDER — FLOVENT HFA 44 MCG/ACT IN AERO: INHALATION_SPRAY | RESPIRATORY_TRACT | 3 refills | Status: DC

## 2021-03-07 NOTE — Patient Instructions (Addendum)
The patient should have follow up scheduled with myself in 6 months.   Take flovent as needed for your asthma symptoms. Gargle after use. Let me know if you're needing to take it more often. We may need to increase or change your medication or consider maintenance dosing.    Try sleeping with a wedge pillow for your reflux  Flonase - 1 spray on each side of your nose twice a day for first week, then 1 spray on each side.   Instructions for use:  If you also use a saline nasal spray or rinse, use that first.  Position the head with the chin slightly tucked. Use the right hand to spray into the left nostril and the right hand to spray into the left nostril.   Point the bottle away from the septum of your nose (cartilage that divides the two sides of your nose).   Hold the nostril closed on the opposite side from where you will spray  Spray once and gently sniff to pull the medicine into the higher parts of your nose.  Don't sniff too hard as the medicine will drain down the back of your throat instead.  Repeat with a second spray on the same side if prescribed.  Repeat on the other side of your nose.   What is GERD? Gastroesophageal reflux disease (GERD) is gastroesophageal reflux diseasewhich occurs when the lower esophageal sphincter (LES) opens spontaneously, for varying periods of time, or does not close properly and stomach contents rise up into the esophagus. GER is also called acid reflux or acid regurgitation, because digestive juices--called acids--rise up with the food. The esophagus is the tube that carries food from the mouth to the stomach. The LES is a ring of muscle at the bottom of the esophagus that acts like a valve between the esophagus and stomach.  When acid reflux occurs, food or fluid can be tasted in the back of the mouth. When refluxed stomach acid touches the lining of the esophagus it may cause a burning sensation in the chest or throat called heartburn or acid  indigestion. Occasional reflux is common. Persistent reflux that occurs more than twice a week is considered GERD, and it can eventually lead to more serious health problems. People of all ages can have GERD. Studies have shown that GERD may worsen or contribute to asthma, chronic cough, and pulmonary fibrosis.   What are the symptoms of GERD? The main symptom of GERD in adults is frequent heartburn, also called acid indigestion--burning-type pain in the lower part of the mid-chest, behind the breast bone, and in the mid-abdomen.  Not all reflux is acidic in nature, and many patients don't have heart burn at all. Sometimes it feels like a cough (either dry or with mucus), choking sensation, asthma, shortness of breath, waking up at night, frequent throat clearing, or trouble swallowing.    What causes GERD? The reason some people develop GERD is still unclear. However, research shows that in people with GERD, the LES relaxes while the rest of the esophagus is working. Anatomical abnormalities such as a hiatal hernia may also contribute to GERD. A hiatal hernia occurs when the upper part of the stomach and the LES move above the diaphragm, the muscle wall that separates the stomach from the chest. Normally, the diaphragm helps the LES keep acid from rising up into the esophagus. When a hiatal hernia is present, acid reflux can occur more easily. A hiatal hernia can occur in people of  any age and is most often a normal finding in otherwise healthy people over age 10. Most of the time, a hiatal hernia produces no symptoms.   Other factors that may contribute to GERD include - Obesity or recent weight gain - Pregnancy  - Smoking  - Diet - Certain medications  Common foods that can worsen reflux symptoms include: - carbonated beverages - artificial sweeteners - citrus fruits  - chocolate  - drinks with caffeine or alcohol  - fatty and fried foods  - garlic and onions  - mint flavorings  - spicy  foods  - tomato-based foods, like spaghetti sauce, salsa, chili, and pizza   Lifestyle Changes If you smoke, stop.  Avoid foods and beverages that worsen symptoms (see above.) Lose weight if needed.  Eat small, frequent meals.  Wear loose-fitting clothes.  Avoid lying down for 3 hours after a meal.  Raise the head of your bed 6 to 8 inches by securing wood blocks under the bedposts. Just using extra pillows will not help, but using a wedge-shaped pillow may be helpful.  Medications  H2 blockers, such as cimetidine (Tagamet HB), famotidine (Pepcid AC), nizatidine (Axid AR), and ranitidine (Zantac 75), decrease acid production. They are available in prescription strength and over-the-counter strength. These drugs provide short-term relief and are effective for about half of those who have GERD symptoms.  Proton pump inhibitors include omeprazole (Prilosec, Zegerid), lansoprazole (Prevacid), pantoprazole (Protonix), rabeprazole (Aciphex), and esomeprazole (Nexium), which are available by prescription. Prilosec is also available in over-the-counter strength. Proton pump inhibitors are more effective than H2 blockers and can relieve symptoms and heal the esophageal lining in almost everyone who has GERD.  Because drugs work in different ways, combinations of medications may help control symptoms. People who get heartburn after eating may take both antacids and H2 blockers. The antacids work first to neutralize the acid in the stomach, and then the H2 blockers act on acid production. By the time the antacid stops working, the H2 blocker will have stopped acid production. Your health care provider is the best source of information about how to use medications for GERD.   Points to Remember 1. You can have GERD without having heartburn. Your symptoms could include a dry cough, asthma symptoms, or trouble swallowing.  2. Taking medications daily as prescribed is important in controlling you symptoms.   Sometimes it can take up to 8 weeks to fully achieve the effects of the medications prescribed.  3. Coughing related to GERD can be difficult to treat and is very frustrating!  However, it is important to stick with these medications and lifestyle modifications before pursuing more aggressive or invasive test and treatments.

## 2021-03-07 NOTE — Progress Notes (Signed)
[name]    [id number]    Feb 16, 1951  Primary Care Physician:Meyers, Jeannett Senior, MD  Referring Physician: Joycelyn Rua, MD [address] [place],  [place] [address] Reason for Consultation: shortness of breath Date of Consultation: 03/07/2021  Chief complaint:   Chief Complaint  Patient presents with  . Consult    Reestablish care, needs medication refills.      HPI: [name] is a 70 y.o. woman with history of asthma. Former patient of Dr. Sherene Sires, hasn't been see in 4 years. She is not taking albuterol much, doesn't like the way it makes her feel. Has symptoms of a tickle in her chest. She has shortness of breath walking up a hill. She gets a "flare up" of her asthma every 2 months and takes flovent for about a week. She does not take prn albuterol. She gargles with water after she uses the flovent. No thrush.   She also notes coughing up mucus after she eats breakfast - almost a cup full of mucus. Drinks coffee and has a cough drop and it goes away.   She was given prednisone at some point but she doesn't think it helped her breathing.   Current Regimen: prn flovent Asthma Triggers: pollen, URIs Exacerbations in the last year: History of hospitalization or intubation: Allergy Testing:  Never had GERD: yes, but on PPI and H2 blocker at night.  Allergic Rhinitis: yes, takes antihistamine, she does have post nasal drainage.  ACT:  Asthma Control Test ACT Total Score  03/07/2021 18   FeNO: never had    Social History   Occupational History  . Occupation: Homemaker  Tobacco Use  . Smoking status: Former Smoker    Packs/day: 1.00    Years: 5.00    Pack years: 5.00    Types: Cigarettes    Quit date: 12/03/1973    Years since quitting: 47.2  . Smokeless tobacco: Never Used  Vaping Use  . Vaping Use: Never used  Substance and Sexual Activity  . Alcohol use: Yes    Comment: wine beer and liquor on occasion   . Drug use: No  . Sexual activity:  Not on file    Relevant family history:  Family History  Problem Relation Age of Onset  . Asthma Brother   . Asthma Paternal Grandfather   . Clotting disorder Mother   . Asthma Brother     Past Medical History:  Diagnosis Date  . Arthritis   . Asthma   . Complication of anesthesia    patient report she had a saddle block during c section and was aware during surgery that was being " cut on"   . GERD (gastroesophageal reflux disease)   . Hypertension   . Pneumonia   . Thoracic ascending aortic aneurysm (HCC) 09/2015   Stable:CTA Chest 10/20/2019: Ascending thoracic aorta greatest diameter 39 mm (compared to 2016 - 37 mm no significant change)..    Past Surgical History:  Procedure Laterality Date  . APPENDECTOMY  2012  . bladder disection    . CESAREAN SECTION     x 2  . Chest CTA  10/2019   CTA Chest 10/20/2019: Ascending thoracic aorta greatest diameter 39 mm (compared to 2016 - 37 mm no significant change)..  . COLON RESECTION  1982   1982  . DILATION AND CURETTAGE, DIAGNOSTIC / THERAPEUTIC    . HERNIA REPAIR     mesh in place   .  TONSILLECTOMY    . 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 TRANSTHORACIC ECHOCARDIOGRAM  10/2019   EF 60 to 65%.  Listed is GRII DD however left atrial size is normal (does not correlate), right atrium normal size.  Aortic valve cannot exclude bicuspid, but no aortic stenosis or sclerosis.  Mild dilation of ascending aorta-4 mm.   Marland Kitchen XI ROBOTIC ASSISTED VENTRAL HERNIA N/A 12/13/2020   Procedure: ROBOTIC INCISIONAL HERNIA REPAIR WITH MESH;  Surgeon: Axel Filler, MD;  Location: [facility];  Service: General;  Laterality: N/A;     Physical Exam: Blood pressure 124/72, pulse 78, temperature 98 F (36.7 C), temperature source Temporal, height 5\' 9"  (1.753 m), weight 196 lb 12.8 oz  (89.3 kg), SpO2 95 %. Gen:      No acute distress ENT:  no nasal polyps, mucus membranes moist, mild cobblestoning in oropharynx Lungs:    No increased respiratory effort, symmetric chest wall excursion, clear to auscultation bilaterally, no wheezes or crackles CV:         Regular rate and rhythm; no murmurs, rubs, or gallops.  No pedal edema Abd:      + bowel sounds; soft, non-tender; no distension MSK: no acute synovitis of DIP or PIP joints, no mechanics hands.  Skin:      Warm and dry; no rashes Neuro: normal speech, no focal facial asymmetry Psych: alert and oriented x3, normal mood and affect   Data Reviewed/Medical Decision Making:  Independent interpretation of tests: Imaging: . Review of patient's CT Angio 2020 images revealed no PE, mild peribronchial thickening. The patient's images have been independently reviewed by me.    PFTs: I have personally reviewed the patient's PFTs and in 2016 they show mild airflow limitation. No significant bronchodilator response. Normal lung volumes and diffusion capacity.  PFT Results Latest Ref Rng & Units 07/12/2015  FVC-Pre L 3.49  FVC-Predicted Pre % 93  FVC-Post L 3.54  FVC-Predicted Post % 94  Pre FEV1/FVC % % 64  Post FEV1/FCV % % 67  FEV1-Pre L 2.24  FEV1-Predicted Pre % 78  FEV1-Post L 2.36  DLCO uncorrected ml/min/mmHg 24.18  DLCO UNC% % 79  DLVA Predicted % 85  TLC L 5.41  TLC % Predicted % 94  RV % Predicted % 73   Echocardiogram Nov 2021 shows normal LVEF, normal RV function, mild dilation of ascending aorta.    Labs:  Lab Results  Component Value Date   WBC 11.6 (H) 12/07/2020   HGB 14.7 12/07/2020   HCT 45.9 12/07/2020   MCV 87.6 12/07/2020   PLT 281 12/07/2020   Lab Results  Component Value Date   NA 140 12/07/2020   K 3.9 12/07/2020   CL 103 12/07/2020   CO2 28 12/07/2020     Immunization status:  Immunization History  Administered Date(s) Administered  . Influenza Split 09/02/2014, 08/04/2015  .  Influenza, High Dose Seasonal PF 09/02/2017  . PFIZER(Purple Top)SARS-COV-2 Vaccination 01/07/2020, 02/01/2020  . Pneumococcal Conjugate-13 01/09/2016    . I reviewed prior external note(s) from Dr. 03/08/2016, cardiology . I reviewed the result(s) of the labs and imaging as noted above.   Assessment:  Mild intermittent asthma, controlled GERD, not well controlled Chronic Rhinitis, not well controlled   Plan/Recommendations:  Continue prn ICS. I've asked her to take albuterol as needed preventively from  Continue H2 blocker and PPI for GERD. I think sleeping with a wedge pillow will help. Start flonase for rhinitis symptoms. Continue as needed anti-histamine for allergy related post nasal drainage.   We discussed disease management and progression at length today.    Return to Care: Return in about 6 months (around 09/06/2021).  Durel Salts, MD Pulmonary and Critical Care Medicine [facility] Office:[phone]  CC: Joycelyn Rua, MD

## 2021-03-07 NOTE — Addendum Note (Signed)
Addended by: Durel Salts on: 03/07/2021 09:42 AM   Modules accepted: Level of Service

## 2021-03-10 ENCOUNTER — Telehealth: Payer: Self-pay | Admitting: Internal Medicine

## 2021-03-10 NOTE — Telephone Encounter (Signed)
The flovent dosage is up to two times a day as needed for symptoms of shortness of breath, chest tightness, wheezing.

## 2021-03-10 NOTE — Telephone Encounter (Signed)
Called and spoke with patient. She stated that she needed to have someone to call [facility] with the information. Advised her that I would.   Called [facility] and spoke with the pharmacist. Provided her with the missing information. She verbalized understanding. They will get the medication ready for shipment.   Nothing further needed at time of call.

## 2021-03-10 NOTE — Telephone Encounter (Signed)
Called and spoke with Andrey Campanile at [facility] regarding patient's prescription for Flovent. Sand stated that she needed the sig clarified with a frequency.  Dr. Celine Mans please advise  RF# [phone]

## 2021-03-29 DIAGNOSIS — I10 Essential (primary) hypertension: Secondary | ICD-10-CM | POA: Diagnosis not present

## 2021-03-29 DIAGNOSIS — J4530 Mild persistent asthma, uncomplicated: Secondary | ICD-10-CM | POA: Diagnosis not present

## 2021-03-29 DIAGNOSIS — K219 Gastro-esophageal reflux disease without esophagitis: Secondary | ICD-10-CM | POA: Diagnosis not present

## 2021-03-29 DIAGNOSIS — E7800 Pure hypercholesterolemia, unspecified: Secondary | ICD-10-CM | POA: Diagnosis not present

## 2021-05-15 DIAGNOSIS — E7800 Pure hypercholesterolemia, unspecified: Secondary | ICD-10-CM | POA: Diagnosis not present

## 2021-05-15 DIAGNOSIS — I10 Essential (primary) hypertension: Secondary | ICD-10-CM | POA: Diagnosis not present

## 2021-05-15 DIAGNOSIS — K219 Gastro-esophageal reflux disease without esophagitis: Secondary | ICD-10-CM | POA: Diagnosis not present

## 2021-05-15 DIAGNOSIS — J4530 Mild persistent asthma, uncomplicated: Secondary | ICD-10-CM | POA: Diagnosis not present

## 2021-07-25 ENCOUNTER — Encounter: Payer: Self-pay | Admitting: Internal Medicine

## 2021-08-29 DIAGNOSIS — E7800 Pure hypercholesterolemia, unspecified: Secondary | ICD-10-CM | POA: Diagnosis not present

## 2021-08-29 DIAGNOSIS — K219 Gastro-esophageal reflux disease without esophagitis: Secondary | ICD-10-CM | POA: Diagnosis not present

## 2021-08-29 DIAGNOSIS — I10 Essential (primary) hypertension: Secondary | ICD-10-CM | POA: Diagnosis not present

## 2021-08-29 DIAGNOSIS — J4530 Mild persistent asthma, uncomplicated: Secondary | ICD-10-CM | POA: Diagnosis not present

## 2021-09-26 DIAGNOSIS — H2513 Age-related nuclear cataract, bilateral: Secondary | ICD-10-CM | POA: Diagnosis not present

## 2021-09-26 DIAGNOSIS — H43812 Vitreous degeneration, left eye: Secondary | ICD-10-CM | POA: Diagnosis not present

## 2021-09-26 DIAGNOSIS — H16223 Keratoconjunctivitis sicca, not specified as Sjogren's, bilateral: Secondary | ICD-10-CM | POA: Diagnosis not present

## 2021-09-26 DIAGNOSIS — H40013 Open angle with borderline findings, low risk, bilateral: Secondary | ICD-10-CM | POA: Diagnosis not present

## 2021-09-28 DIAGNOSIS — H2513 Age-related nuclear cataract, bilateral: Secondary | ICD-10-CM | POA: Diagnosis not present

## 2021-09-28 DIAGNOSIS — H25013 Cortical age-related cataract, bilateral: Secondary | ICD-10-CM | POA: Diagnosis not present

## 2021-09-28 DIAGNOSIS — H2511 Age-related nuclear cataract, right eye: Secondary | ICD-10-CM | POA: Diagnosis not present

## 2021-09-28 DIAGNOSIS — H18413 Arcus senilis, bilateral: Secondary | ICD-10-CM | POA: Diagnosis not present

## 2021-09-28 DIAGNOSIS — H40013 Open angle with borderline findings, low risk, bilateral: Secondary | ICD-10-CM | POA: Diagnosis not present

## 2021-09-29 ENCOUNTER — Telehealth: Payer: Self-pay

## 2021-09-29 NOTE — Telephone Encounter (Signed)
   [facility] Pre-operative Risk Assessment    Patient Name: [name]  DOB: 1951/08/04 MRN: [id number]    Request for surgical clearance:  What type of surgery is being performed   Right and Left Cataract Extraction   When is this surgery scheduled  12/13/21 and 12/27/21  What type of clearance is required   Both  Are there any medications that need to be held prior to surgery and how long   Aspirin  Practice name and name of physician performing surgery  [facility]   What is the office phone number  [phone]   7.   What is the office fax number  [phone]  8.   Anesthesia type    Not listed   Neoma Laming 09/29/2021, 12:23 PM  _________________________________________________________________   (provider comments below)

## 2021-10-02 NOTE — Telephone Encounter (Signed)
   Patient Name: [name]  DOB: 1951/08/04 MRN: [id number]  Primary Cardiologist: Bryan Lemma, MD  Chart reviewed as part of pre-operative protocol coverage. Cataract extractions are recognized in guidelines as low risk surgeries that do not typically require specific preoperative testing or holding of blood thinner therapy. Therefore, given past medical history and time since last visit, based on ACC/AHA guidelines, [name] would be at acceptable risk for the planned procedure without further cardiovascular testing.   I will route this recommendation to the requesting party via Epic fax function and remove from pre-op pool.  Please call with questions.  Beatriz Stallion, PA-C 10/02/2021, 1:00 PM

## 2021-11-15 ENCOUNTER — Ambulatory Visit (HOSPITAL_COMMUNITY)
Admission: RE | Admit: 2021-11-15 | Discharge: 2021-11-15 | Disposition: A | Discharge status: Home / Self Care | Payer: Medicare Other | Source: Ambulatory Visit | Attending: Cardiology | Admitting: Cardiology

## 2021-11-15 DIAGNOSIS — I7121 Aneurysm of the ascending aorta, without rupture: Secondary | ICD-10-CM | POA: Diagnosis not present

## 2021-11-15 DIAGNOSIS — I359 Nonrheumatic aortic valve disorder, unspecified: Secondary | ICD-10-CM | POA: Insufficient documentation

## 2021-11-15 DIAGNOSIS — I77810 Thoracic aortic ectasia: Secondary | ICD-10-CM | POA: Diagnosis not present

## 2021-11-15 LAB — POCT I-STAT CREATININE: Creatinine, Ser: 0.8 mg/dL (ref 0.44–1.00)

## 2021-11-15 MED ORDER — IOHEXOL 350 MG/ML SOLN
100.0000 mL | Freq: Once | INTRAVENOUS | Status: AC | PRN
  Administered 2021-11-15: 10:00:00 100 mL via INTRAVENOUS

## 2021-11-15 MED ORDER — SODIUM CHLORIDE (PF) 0.9 % IJ SOLN
INTRAMUSCULAR | Status: AC
  Filled 2021-11-15: qty 50

## 2021-11-22 DIAGNOSIS — I712 Thoracic aortic aneurysm, without rupture, unspecified: Secondary | ICD-10-CM | POA: Diagnosis not present

## 2021-11-22 DIAGNOSIS — I700 Atherosclerosis of aorta: Secondary | ICD-10-CM | POA: Diagnosis not present

## 2021-11-22 DIAGNOSIS — E7800 Pure hypercholesterolemia, unspecified: Secondary | ICD-10-CM | POA: Diagnosis not present

## 2021-11-22 DIAGNOSIS — I10 Essential (primary) hypertension: Secondary | ICD-10-CM | POA: Diagnosis not present

## 2021-11-22 DIAGNOSIS — M79641 Pain in right hand: Secondary | ICD-10-CM | POA: Diagnosis not present

## 2021-11-22 DIAGNOSIS — K219 Gastro-esophageal reflux disease without esophagitis: Secondary | ICD-10-CM | POA: Diagnosis not present

## 2021-11-22 DIAGNOSIS — J4530 Mild persistent asthma, uncomplicated: Secondary | ICD-10-CM | POA: Diagnosis not present

## 2021-11-22 DIAGNOSIS — Z0000 Encounter for general adult medical examination without abnormal findings: Secondary | ICD-10-CM | POA: Diagnosis not present

## 2021-12-06 ENCOUNTER — Other Ambulatory Visit: Payer: Self-pay

## 2021-12-06 ENCOUNTER — Ambulatory Visit: Payer: Medicare Other | Admitting: Cardiology

## 2021-12-06 ENCOUNTER — Encounter: Payer: Self-pay | Admitting: Cardiology

## 2021-12-06 VITALS — BP 130/80 | HR 75 | Ht 69.0 in | Wt 192.0 lb

## 2021-12-06 DIAGNOSIS — I359 Nonrheumatic aortic valve disorder, unspecified: Secondary | ICD-10-CM

## 2021-12-06 DIAGNOSIS — I10 Essential (primary) hypertension: Secondary | ICD-10-CM | POA: Diagnosis not present

## 2021-12-06 DIAGNOSIS — I77810 Thoracic aortic ectasia: Secondary | ICD-10-CM

## 2021-12-06 DIAGNOSIS — E785 Hyperlipidemia, unspecified: Secondary | ICD-10-CM | POA: Diagnosis not present

## 2021-12-06 MED ORDER — HYDROCHLOROTHIAZIDE 25 MG PO TABS: 25.0000 mg | ORAL_TABLET | Freq: Every day | ORAL | 3 refills | Status: DC

## 2021-12-06 NOTE — Progress Notes (Signed)
Primary Care Provider: Joycelyn Rua, MD Cardiologist: Bryan Lemma, MD Electrophysiologist: None  Clinic Note: Chief Complaint  Patient presents with   Follow-up    Blood pressure running little high.  Otherwise stable.    ===================================  ASSESSMENT/PLAN   Problem List Items Addressed This Visit       Cardiology Problems   Essential hypertension (Chronic)    She says that today's is actually a low pressure for her..  Systolic pressures been running into the 140 mmHg range.  Plan: Increase HCTZ dose to 25 mg.        Relevant Medications   hydrochlorothiazide (HYDRODIURIL) 25 MG tablet   Thoracic aortic ectasia (HCC) - Primary (Chronic)    Relatively stable overall finding.  Mild increase.  I think we can reassess at with CTA chest.  We will see her back in a year and plan to order a follow-up CT scan for the following year.  (December 2024)  Continue aggressive management of blood pressure and lipids.  Recent check of lipids showed outstanding lipid control and blood pressures are little high.  Will increase HCTZ dose to full 25 mg.      Relevant Medications   hydrochlorothiazide (HYDRODIURIL) 25 MG tablet   Other Relevant Orders   EKG 12-Lead (Completed)   Aortic valve disease (Chronic)    Mild aortic sclerosis but no stenosis.  Murmur heard but not yet stenotic.  If murmur worsens, or if there is a notable change in thoracic aortic diameter, will recheck echo.      Relevant Medications   hydrochlorothiazide (HYDRODIURIL) 25 MG tablet   Other Relevant Orders   EKG 12-Lead (Completed)   Dyslipidemia, goal LDL below 100 (Chronic)    Outstanding lipids on most recent check on the only 10 mg rosuvastatin.  Continue.      Relevant Medications   hydrochlorothiazide (HYDRODIURIL) 25 MG tablet   ===================================  HPI:    [name] is a 71 y.o. female with a PMH notable for borderline HTN, HLD with Thoracic  Aortic Ectasia who presents today for annual follow-up.  [name] was last seen on 10/21/2020 -> plan was to reassess with a CT angiogram every 2 to 3 years.  Will be due prior to this visit.  (Scheduled for November 2022.  Recommended targeting LDL less than 100.  Cleared for hernia surgery.  There was concern for possible aortic valve disease but no evidence of stenosis.  Recent Hospitalizations: None  Reviewed  CV studies:    The following studies were reviewed today: (if available, images/films reviewed: From Epic Chart or Care Everywhere) CTA Chest Aorta 11/15/2021: Minimal interval increase in the thoracic aortic dilation now measuring 4.0 cm from 3.8.  (Radiology recommendation annual follow-up-not necessary)   Interval History:   [name] returns today overall doing pretty well.  She has occasional palpitations here and there but really minimal.  Her blood pressures were little bit higher of late.  Today actually seems to be a little lower than her home pressures.  She otherwise is doing well with no major complaints.  Tolerating medications.  No active cardiac symptoms.  CV Review of Symptoms (Summary) Cardiovascular ROS: no chest pain or dyspnea on exertion negative for - edema, irregular heartbeat, orthopnea, palpitations, paroxysmal nocturnal dyspnea, rapid heart rate, shortness of breath, or lightheadedness, dizziness or wooziness, syncope/near syncope or TIA/amaurosis fugax, claudication  REVIEWED OF SYSTEMS   Review of Systems  Constitutional:  Negative for malaise/fatigue and  weight loss.  Respiratory:  Negative for shortness of breath.   Gastrointestinal:  Negative for blood in stool and melena.  Genitourinary:  Negative for hematuria.  Musculoskeletal:  Negative for joint pain.  Neurological:  Negative for dizziness and focal weakness.  Psychiatric/Behavioral:  Negative for depression and memory loss. The patient is not nervous/anxious and does not have  insomnia.    I have reviewed and (if needed) personally updated the patient's problem list, medications, allergies, past medical and surgical history, social and family history.   PAST MEDICAL HISTORY   Past Medical History:  Diagnosis Date   Arthritis    Asthma    Complication of anesthesia    patient report she had a saddle block during c section and was aware during surgery that was being " cut on"    GERD (gastroesophageal reflux disease)    Hypertension    Pneumonia    Thoracic ascending aortic aneurysm 09/2015   Stable:CTA Chest 10/20/2019: Ascending thoracic aorta greatest diameter 39 mm (compared to 2016 - 37 mm no significant change).Marland Kitchen    PAST SURGICAL HISTORY   Past Surgical History:  Procedure Laterality Date   APPENDECTOMY  2012   bladder disection     CESAREAN SECTION     x 2   Chest CTA  10/2019   CTA Chest 10/20/2019: Ascending thoracic aorta greatest diameter 39 mm (compared to 2016 - 37 mm no significant change)..   COLON RESECTION  1982   1982   DILATION AND CURETTAGE, DIAGNOSTIC / THERAPEUTIC     HERNIA REPAIR     mesh in place    TONSILLECTOMY     TOTAL HIP ARTHROPLASTY Right 10/28/2018   Procedure: RIGHT TOTAL HIP ARTHROPLASTY ANTERIOR APPROACH;  Surgeon: Paralee Cancel, MD;  Location: [facility];  Service: Orthopedics;  Laterality: Right;  70 mins   TRANSTHORACIC ECHOCARDIOGRAM  10/2019   EF 60 to 65%.  Listed is GRII DD however left atrial size is normal (does not correlate), right atrium normal size.  Aortic valve cannot exclude bicuspid, but no aortic stenosis or sclerosis.  Mild dilation of ascending aorta-4 mm.    XI ROBOTIC ASSISTED VENTRAL HERNIA N/A 12/13/2020   Procedure: ROBOTIC INCISIONAL HERNIA REPAIR WITH MESH;  Surgeon: Ralene Ok, MD;  Location: [facility];  Service: General;  Laterality: N/A;    Immunization History  Administered Date(s) Administered   Influenza Split 09/02/2014, 08/04/2015   Influenza, High Dose Seasonal PF 09/02/2017    PFIZER(Purple Top)SARS-COV-2 Vaccination 01/07/2020, 02/01/2020   Pneumococcal Conjugate-13 01/09/2016    MEDICATIONS/ALLERGIES   Current Meds  Medication Sig   albuterol (VENTOLIN HFA) 108 (90 Base) MCG/ACT inhaler 2 puffs as needed   Ascorbic Acid (VITAMIN C PO) Take 1 tablet by mouth daily.    aspirin EC 81 MG tablet Take 81 mg by mouth daily. Swallow whole.   aspirin-acetaminophen-caffeine (EXCEDRIN MIGRAINE) 250-250-65 MG tablet Take 2 tablets by mouth every 6 (six) hours as needed for headache.   b complex vitamins tablet Take 1 tablet by mouth daily.   CALCIUM-MAGNESIUM PO Take 1 tablet by mouth daily.   Cholecalciferol (VITAMIN D) 50 MCG (2000 UT) tablet Take 2,000 Units by mouth daily.   famotidine-calcium carbonate-magnesium hydroxide (PEPCID COMPLETE) 10-800-165 MG chewable tablet Chew 1 tablet by mouth at bedtime.   FIBER DIET TABS Take 1 tablet by mouth daily.   fluticasone (FLONASE) 50 MCG/ACT nasal spray Place 1 spray into both nostrils daily.   fluticasone (FLOVENT HFA) 44 MCG/ACT inhaler  Take 2 puffs as needed for cough, wheezing, shortness of breath from asthma.   hydrochlorothiazide (HYDRODIURIL) 25 MG tablet Take 1/2 tablet (12.5 mg total) by mouth daily.   LECITHIN PO Take 1 tablet by mouth daily.   loratadine (CLARITIN) 10 MG tablet Take 10 mg by mouth daily as needed for allergies.   Misc Natural Products (GLUCOSAMINE CHONDROITIN TRIPLE) TABS Take 1 tablet by mouth daily.   Multiple Vitamin (MULTIVITAMIN) capsule Take 1 capsule by mouth daily.   omeprazole (PRILOSEC) 20 MG capsule Take 20 mg by mouth daily.   Probiotic Product (PROBIOTIC PO) Take 1 capsule by mouth daily.   RESTASIS 0.05 % ophthalmic emulsion Place 1 drop into both eyes 2 (two) times daily.    rosuvastatin (CRESTOR) 10 MG tablet Take 10 mg by mouth every evening.   [EXPIRED] traMADol (ULTRAM) 50 MG tablet Take 1 tablet (50 mg total) by mouth every 6 (six) hours as needed.    Allergies  Allergen  Reactions   Dulera [Mometasone Furo-Formoterol Fum] Palpitations    SOCIAL HISTORY/FAMILY HISTORY   Reviewed in Epic:  Pertinent findings:  Social History   Tobacco Use   Smoking status: Former    Packs/day: 1.00    Years: 5.00    Pack years: 5.00    Types: Cigarettes    Quit date: 12/03/1973    Years since quitting: 48.0   Smokeless tobacco: Never  Vaping Use   Vaping Use: Never used  Substance Use Topics   Alcohol use: Yes    Comment: wine beer and liquor on occasion    Drug use: No   Social History   Social History Narrative   Not on file    OBJCTIVE -PE, EKG, labs   Wt Readings from Last 3 Encounters:  12/06/21 192 lb (87.1 kg)  03/07/21 196 lb 12.8 oz (89.3 kg)  12/13/20 195 lb 1.7 oz (88.5 kg)    Physical Exam: BP 130/80 (BP Location: Left Arm)    Pulse 75    Ht 5\' 9"  (1.753 m)    Wt 192 lb (87.1 kg)    LMP  (LMP Unknown)    SpO2 98%    BMI 28.35 kg/m  Physical Exam Vitals reviewed.  Constitutional:      Appearance: Normal appearance.  Neck:     Vascular: No carotid bruit or JVD.  Cardiovascular:     Rate and Rhythm: Normal rate and regular rhythm. No extrasystoles are present.    Chest Wall: PMI is not displaced.     Pulses: Normal pulses.     Heart sounds: S1 normal and S2 normal. Heart sounds not distant. Murmur heard.  High-pitched harsh crescendo-decrescendo early systolic murmur is present with a grade of 1/6 at the upper right sternal border radiating to the neck.    No friction rub. No gallop.  Pulmonary:     Effort: Pulmonary effort is normal. No respiratory distress.     Breath sounds: Normal breath sounds.  Chest:     Chest wall: No tenderness.  Musculoskeletal:        General: No swelling. Normal range of motion.     Cervical back: Normal range of motion and neck supple.  Skin:    General: Skin is warm and dry.  Neurological:     General: No focal deficit present.     Mental Status: She is alert and oriented to person, place, and  time.  Psychiatric:        Mood and Affect:  Mood normal.        Behavior: Behavior normal.        Thought Content: Thought content normal.        Judgment: Judgment normal.     Adult ECG Report  Rate: 75 ;  Rhythm: normal sinus rhythm; normal axis, intervals durations  Narrative Interpretation: Normal  Recent Labs:   11/22/2021: TC 141, TG 116, HDL 61, LDL 62. Lab Results  Component Value Date   CREATININE 0.80 11/15/2021   BUN 12 12/07/2020   NA 140 12/07/2020   K 3.9 12/07/2020   CL 103 12/07/2020   CO2 28 12/07/2020   No results found for: CHOL, HDL, LDLCALC, LDLDIRECT, TRIG, CHOLHDL  No results found for: HGBA1C No results found for: TSH  ==================================================  COVID-19 Education: The signs and symptoms of COVID-19 were discussed with the patient and how to seek care for testing (follow up with PCP or arrange E-visit).    I spent a total of 19 minutes with the patient spent in direct patient consultation.  Additional time spent with chart review  / charting (studies, outside notes, etc): 8 min Total Time: 27 min  Current medicines are reviewed at length with the patient today.  (+/- concerns) none  This visit occurred during the SARS-CoV-2 public health emergency.  Safety protocols were in place, including screening questions prior to the visit, additional usage of staff PPE, and extensive cleaning of exam room while observing appropriate contact time as indicated for disinfecting solutions.  Notice: This dictation was prepared with Dragon dictation along with smart phrase technology. Any transcriptional errors that result from this process are unintentional and may not be corrected upon review.  Studies Ordered:   Orders Placed This Encounter  Procedures   EKG 12-Lead    Patient Instructions / Medication Changes & Studies & Tests Ordered   Patient Instructions  Medication Instructions:   Increase HCTZ to 25 mg ( 1 tablet) daily     *If you need a refill on your cardiac medications before your next appointment, please call your pharmacy*   Lab Work: Not needed   Testing/Procedures: Not needed   Follow-Up: At [facility], you and your health needs are our priority.  As part of our continuing mission to provide you with exceptional heart care, we have created designated Provider Care Teams.  These Care Teams include your primary Cardiologist (physician) and Advanced Practice Providers (APPs -  Physician Assistants and Nurse Practitioners) who all work together to provide you with the care you need, when you need it.  We recommend signing up for the patient portal called "MyChart".  Sign up information is provided on this After Visit Summary.  MyChart is used to connect with patients for Virtual Visits (Telemedicine).  Patients are able to view lab/test results, encounter notes, upcoming appointments, etc.  Non-urgent messages can be sent to your provider as well.   To learn more about what you can do with MyChart, go to [email or web address].    Your next appointment:   12 month(s)  The format for your next appointment:   In Person  Provider:   Glenetta Hew, MD          Glenetta Hew, M.D., M.S. Interventional Cardiologist   Pager # [phone] Phone # [phone] [address] [place], [place] [address]   Thank you for choosing Heartcare at [facility]!!

## 2021-12-06 NOTE — Patient Instructions (Signed)
Medication Instructions:   Increase HCTZ to 25 mg ( 1 tablet) daily    *If you need a refill on your cardiac medications before your next appointment, please call your pharmacy*   Lab Work: Not needed   Testing/Procedures: Not needed   Follow-Up: At [facility], you and your health needs are our priority.  As part of our continuing mission to provide you with exceptional heart care, we have created designated Provider Care Teams.  These Care Teams include your primary Cardiologist (physician) and Advanced Practice Providers (APPs -  Physician Assistants and Nurse Practitioners) who all work together to provide you with the care you need, when you need it.  We recommend signing up for the patient portal called "MyChart".  Sign up information is provided on this After Visit Summary.  MyChart is used to connect with patients for Virtual Visits (Telemedicine).  Patients are able to view lab/test results, encounter notes, upcoming appointments, etc.  Non-urgent messages can be sent to your provider as well.   To learn more about what you can do with MyChart, go to [email or web address].    Your next appointment:   12 month(s)  The format for your next appointment:   In Person  Provider:   Glenetta Hew, MD

## 2021-12-13 DIAGNOSIS — H2511 Age-related nuclear cataract, right eye: Secondary | ICD-10-CM | POA: Diagnosis not present

## 2021-12-14 DIAGNOSIS — H2512 Age-related nuclear cataract, left eye: Secondary | ICD-10-CM | POA: Diagnosis not present

## 2021-12-16 ENCOUNTER — Encounter: Payer: Self-pay | Admitting: Cardiology

## 2021-12-16 DIAGNOSIS — I10 Essential (primary) hypertension: Secondary | ICD-10-CM | POA: Insufficient documentation

## 2021-12-16 NOTE — Assessment & Plan Note (Signed)
She says that today's is actually a low pressure for her..  Systolic pressures been running into the 140 mmHg range.  Plan: Increase HCTZ dose to 25 mg.

## 2021-12-16 NOTE — Assessment & Plan Note (Signed)
Outstanding lipids on most recent check on the only 10 mg rosuvastatin.  Continue.

## 2021-12-16 NOTE — Assessment & Plan Note (Signed)
Mild aortic sclerosis but no stenosis.  Murmur heard but not yet stenotic.  If murmur worsens, or if there is a notable change in thoracic aortic diameter, will recheck echo.

## 2021-12-16 NOTE — Assessment & Plan Note (Addendum)
Relatively stable overall finding.  Mild increase.  I think we can reassess at with CTA chest.  We will see her back in a year and plan to order a follow-up CT scan for the following year.  (December 2024)  Continue aggressive management of blood pressure and lipids.  Recent check of lipids showed outstanding lipid control and blood pressures are little high.  Will increase HCTZ dose to full 25 mg.

## 2021-12-24 DIAGNOSIS — J4530 Mild persistent asthma, uncomplicated: Secondary | ICD-10-CM | POA: Diagnosis not present

## 2021-12-24 DIAGNOSIS — I10 Essential (primary) hypertension: Secondary | ICD-10-CM | POA: Diagnosis not present

## 2021-12-24 DIAGNOSIS — E7800 Pure hypercholesterolemia, unspecified: Secondary | ICD-10-CM | POA: Diagnosis not present

## 2021-12-24 DIAGNOSIS — K219 Gastro-esophageal reflux disease without esophagitis: Secondary | ICD-10-CM | POA: Diagnosis not present

## 2021-12-27 DIAGNOSIS — H2512 Age-related nuclear cataract, left eye: Secondary | ICD-10-CM | POA: Diagnosis not present

## 2022-02-05 DIAGNOSIS — Z1231 Encounter for screening mammogram for malignant neoplasm of breast: Secondary | ICD-10-CM | POA: Diagnosis not present

## 2022-03-14 DIAGNOSIS — Z780 Asymptomatic menopausal state: Secondary | ICD-10-CM | POA: Diagnosis not present

## 2022-03-26 DIAGNOSIS — R350 Frequency of micturition: Secondary | ICD-10-CM | POA: Diagnosis not present

## 2022-03-26 DIAGNOSIS — R3915 Urgency of urination: Secondary | ICD-10-CM | POA: Diagnosis not present

## 2022-03-26 DIAGNOSIS — N390 Urinary tract infection, site not specified: Secondary | ICD-10-CM | POA: Diagnosis not present

## 2022-04-03 DIAGNOSIS — H40013 Open angle with borderline findings, low risk, bilateral: Secondary | ICD-10-CM | POA: Diagnosis not present

## 2022-05-03 DIAGNOSIS — R3915 Urgency of urination: Secondary | ICD-10-CM | POA: Diagnosis not present

## 2022-05-03 DIAGNOSIS — R300 Dysuria: Secondary | ICD-10-CM | POA: Diagnosis not present

## 2022-08-03 DIAGNOSIS — K5904 Chronic idiopathic constipation: Secondary | ICD-10-CM | POA: Diagnosis not present

## 2022-08-17 DIAGNOSIS — S92502A Displaced unspecified fracture of left lesser toe(s), initial encounter for closed fracture: Secondary | ICD-10-CM | POA: Diagnosis not present

## 2022-09-04 ENCOUNTER — Other Ambulatory Visit: Payer: Self-pay | Admitting: Cardiology

## 2022-09-18 DIAGNOSIS — Z23 Encounter for immunization: Secondary | ICD-10-CM | POA: Diagnosis not present

## 2022-09-21 DIAGNOSIS — S92502D Displaced unspecified fracture of left lesser toe(s), subsequent encounter for fracture with routine healing: Secondary | ICD-10-CM | POA: Diagnosis not present

## 2022-09-21 DIAGNOSIS — M792 Neuralgia and neuritis, unspecified: Secondary | ICD-10-CM | POA: Diagnosis not present

## 2022-10-02 DIAGNOSIS — H16223 Keratoconjunctivitis sicca, not specified as Sjogren's, bilateral: Secondary | ICD-10-CM | POA: Diagnosis not present

## 2022-10-02 DIAGNOSIS — H40013 Open angle with borderline findings, low risk, bilateral: Secondary | ICD-10-CM | POA: Diagnosis not present

## 2022-10-02 DIAGNOSIS — H26493 Other secondary cataract, bilateral: Secondary | ICD-10-CM | POA: Diagnosis not present

## 2022-10-02 DIAGNOSIS — H43813 Vitreous degeneration, bilateral: Secondary | ICD-10-CM | POA: Diagnosis not present

## 2022-11-09 ENCOUNTER — Ambulatory Visit: Payer: Medicare Other | Admitting: Primary Care

## 2022-11-09 ENCOUNTER — Encounter: Payer: Self-pay | Admitting: Primary Care

## 2022-11-09 VITALS — BP 122/76 | HR 75 | Ht 69.0 in | Wt 189.0 lb

## 2022-11-09 DIAGNOSIS — J4530 Mild persistent asthma, uncomplicated: Secondary | ICD-10-CM

## 2022-11-09 DIAGNOSIS — K5900 Constipation, unspecified: Secondary | ICD-10-CM | POA: Diagnosis not present

## 2022-11-09 DIAGNOSIS — J4520 Mild intermittent asthma, uncomplicated: Secondary | ICD-10-CM | POA: Diagnosis not present

## 2022-11-09 MED ORDER — FLUTICASONE PROPIONATE HFA 44 MCG/ACT IN AERO: INHALATION_SPRAY | RESPIRATORY_TRACT | 5 refills | Status: DC

## 2022-11-09 NOTE — Progress Notes (Unsigned)
@Patient  ID: , female    DOB: 03-03-1951, 71 y.o.   MRN: [id number]  Chief Complaint  Patient presents with   Follow-up    Asthma,Cough  ACT:17 Needs refills on medications     Referring provider: [id number], MD  HPI: 71 year old female, former smoker.  Past medical history significant for HTN, aortic valve disease, thoracic aortic ectasia, dyslipidemia, mild persistent chronic asthma, upper airway cough syndrome.  Patient of Dr. 61  11/09/2022 Patient presents today for overdue follow-up/Asthma.  She reports worsening cough symptoms recently  Occasional wheezing with cough No shortness of breath  She lost her inhaler several weeks ago  She will take inhaler 1-2 times a day for a week and will not need  She is taking Flonase and reflux medication as directed. She takes Claritin as needed She Flovent does cause some voice hoarseness after taking for several days, this is manageable but does affect her singing voice   Allergies  Allergen Reactions   Other Cough    Animal dander    Dulera [Mometasone Furo-Formoterol Fum] Palpitations    Immunization History  Administered Date(s) Administered   Fluad Quad(high Dose 65+) 09/10/2022   Influenza Split 09/02/2014, 08/04/2015   Influenza, High Dose Seasonal PF 09/02/2017, 09/18/2019   Influenza,inj,quad, With Preservative 09/25/2017   Influenza-Unspecified 09/02/2017, 09/18/2019   PFIZER(Purple Top)SARS-COV-2 Vaccination 01/07/2020, 02/01/2020   Pneumococcal Conjugate-13 01/09/2016   Zoster Recombinat (Shingrix) 04/16/2018    Past Medical History:  Diagnosis Date   Arthritis    Asthma    Complication of anesthesia    patient report she had a saddle block during c section and was aware during surgery that was being " cut on"    GERD (gastroesophageal reflux disease)    Hypertension    Pneumonia    Thoracic ascending aortic aneurysm (HCC) 09/2015   Stable:CTA Chest 10/20/2019: Ascending thoracic  aorta greatest diameter 39 mm (compared to 2016 - 37 mm no significant change)..    Tobacco History: Social History   Tobacco Use  Smoking Status Former   Packs/day: 1.00   Years: 5.00   Total pack years: 5.00   Types: Cigarettes   Quit date: 12/03/1973   Years since quitting: 48.9  Smokeless Tobacco Never   Counseling given: Not Answered   Outpatient Medications Prior to Visit  Medication Sig Dispense Refill   albuterol (VENTOLIN HFA) 108 (90 Base) MCG/ACT inhaler 2 puffs as needed     Ascorbic Acid (VITAMIN C PO) Take 1 tablet by mouth daily.      b complex vitamins tablet Take 1 tablet by mouth daily.     CALCIUM-MAGNESIUM PO Take 1 tablet by mouth daily.     Cholecalciferol (VITAMIN D) 50 MCG (2000 UT) tablet Take 2,000 Units by mouth daily.     famotidine-calcium carbonate-magnesium hydroxide (PEPCID COMPLETE) 10-800-165 MG chewable tablet Chew 1 tablet by mouth at bedtime.     FIBER DIET TABS Take 1 tablet by mouth daily.     fluticasone (FLONASE) 50 MCG/ACT nasal spray Place 1 spray into both nostrils daily. 1 g 3   fluticasone (FLOVENT HFA) 44 MCG/ACT inhaler Take 2 puffs as needed for cough, wheezing, shortness of breath from asthma. 3 each 3   hydrochlorothiazide (HYDRODIURIL) 25 MG tablet Take 1 tablet (25 mg total) by mouth daily. 90 tablet 1   LECITHIN PO Take 1 tablet by mouth daily.     loratadine (CLARITIN) 10 MG tablet Take 10 mg by mouth daily  as needed for allergies.     Misc Natural Products (GLUCOSAMINE CHONDROITIN TRIPLE) TABS Take 1 tablet by mouth daily.     Multiple Vitamin (MULTIVITAMIN) capsule Take 1 capsule by mouth daily.     omeprazole (PRILOSEC) 20 MG capsule Take 20 mg by mouth daily.     Probiotic Product (PROBIOTIC PO) Take 1 capsule by mouth daily.     RESTASIS 0.05 % ophthalmic emulsion Place 1 drop into both eyes 2 (two) times daily.      rosuvastatin (CRESTOR) 10 MG tablet Take 10 mg by mouth every evening.     aspirin EC 81 MG tablet Take  81 mg by mouth daily. Swallow whole. (Patient not taking: Reported on 11/09/2022)     aspirin-acetaminophen-caffeine (EXCEDRIN MIGRAINE) 250-250-65 MG tablet Take 2 tablets by mouth every 6 (six) hours as needed for headache. (Patient not taking: Reported on 11/09/2022)     cycloSPORINE (RESTASIS) 0.05 % ophthalmic emulsion 1  into affected eye (Patient not taking: Reported on 11/09/2022)     prednisoLONE acetate (PRED FORTE) 1 % ophthalmic suspension Place 1 drop into the right eye 4 (four) times daily. (Patient not taking: Reported on 11/09/2022)     No facility-administered medications prior to visit.      Review of Systems  Review of Systems  Constitutional: Negative.   HENT: Negative.    Respiratory:  Positive for cough and wheezing. Negative for shortness of breath.      Physical Exam  BP 122/76 (BP Location: Right Arm, Patient Position: Sitting, Cuff Size: Normal)   Pulse 75   Ht 5\' 9"  (1.753 m)   Wt 189 lb (85.7 kg)   LMP  (LMP Unknown)   SpO2 98%   BMI 27.91 kg/m  Physical Exam Constitutional:      Appearance: Normal appearance.  HENT:     Head: Normocephalic and atraumatic.     Mouth/Throat:     Mouth: Mucous membranes are moist.     Pharynx: Oropharynx is clear.  Cardiovascular:     Rate and Rhythm: Normal rate and regular rhythm.  Pulmonary:     Effort: Pulmonary effort is normal.     Breath sounds: Normal breath sounds.  Musculoskeletal:        General: Normal range of motion.  Skin:    General: Skin is warm and dry.  Neurological:     General: No focal deficit present.     Mental Status: She is alert and oriented to person, place, and time. Mental status is at baseline.  Psychiatric:        Mood and Affect: Mood normal.        Behavior: Behavior normal.        Thought Content: Thought content normal.        Judgment: Judgment normal.      Lab Results:  CBC    Component Value Date/Time   WBC 11.6 (H) 12/07/2020 1200   RBC 5.24 (H) 12/07/2020  1200   HGB 14.7 12/07/2020 1200   HCT 45.9 12/07/2020 1200   PLT 281 12/07/2020 1200   MCV 87.6 12/07/2020 1200   MCH 28.1 12/07/2020 1200   MCHC 32.0 12/07/2020 1200   RDW 14.2 12/07/2020 1200   LYMPHSABS 4.1 (H) 10/30/2016 1705   MONOABS 0.7 10/30/2016 1705   EOSABS 0.2 10/30/2016 1705   BASOSABS 0.1 10/30/2016 1705    BMET    Component Value Date/Time   NA 140 12/07/2020 1200   NA 142 10/08/2019 1439  K 3.9 12/07/2020 1200   CL 103 12/07/2020 1200   CO2 28 12/07/2020 1200   GLUCOSE 98 12/07/2020 1200   BUN 12 12/07/2020 1200   BUN 17 10/08/2019 1439   CREATININE 0.80 11/15/2021 0943   CALCIUM 9.7 12/07/2020 1200   GFRNONAA >60 12/07/2020 1200   GFRAA 82 10/08/2019 1439    BNP No results found for: "BNP"  ProBNP No results found for: "PROBNP"  Imaging: No results found.   Assessment & Plan:   No problem-specific Assessment & Plan notes found for this encounter.     Glenford Bayley, NP 11/09/2022

## 2022-11-09 NOTE — Patient Instructions (Addendum)
Recommendations: - Restart Flovent inhaler- take 1-2 puffs every 12 hours as needed for cough (use with spacer to help with voice hoarseness, rinse mouth after use) - Continue flonase nasal spray daily - Continue reflux medication as directed  - Take Claritin 10mg  daily as needed for allergy symptoms - Call if cough does not improve   Rx: - Refill [facility]  Follow-up: - 6 months with Dr. or sooner if needed    Asthma, Adult  Asthma is a condition that causes swelling and narrowing of the airways. These are the passages that lead from the nose and mouth down into the lungs. When asthma symptoms get worse it is called an asthma attack or flare. This can make it hard to breathe. Asthma flares can range from minor to life-threatening. There is no cure for asthma, but medicines and lifestyle changes can help to control it. What are the causes? It is not known exactly what causes asthma, but certain things can cause asthma symptoms to get worse (triggers). What can trigger an asthma attack? Cigarette smoke. Mold. Dust. Your pet's skin flakes (dander). Cockroaches. Pollen. Air pollution (like household cleaners, wood smoke, smog, or [name]). What are the signs or symptoms? Trouble breathing (shortness of breath). Coughing. Making high-pitched whistling sounds when you breathe, most often when you breathe out (wheezing). Chest tightness. Tiredness with little activity. Poor exercise tolerance. How is this treated? Controller medicines that help prevent asthma symptoms. Fast-acting reliever or rescue medicines. These give short-term relief of asthma symptoms. Allergy medicines if your attacks are brought on by allergens. Medicines to help control the body's defense (immune) system. Staying away from the things that cause asthma attacks. Follow these instructions at home: Avoiding triggers in your home Do not allow anyone to smoke in your home. Limit use  of fireplaces and wood stoves. Get rid of pests (such as roaches and mice) and their droppings. Keep your home clean. Clean your floors. Dust regularly. Use cleaning products that do not smell. Wash bed sheets and blankets every week in hot water. Dry them in a dryer. Have someone vacuum when you are not home. Change your heating and air conditioning filters often. Use blankets that are made of polyester or cotton. General instructions Take over-the-counter and prescription medicines only as told by your doctor. Do not smoke or use any products that contain nicotine or tobacco. If you need help quitting, ask your doctor. Stay away from secondhand smoke. Avoid doing things outdoors when allergen counts are high and when air quality is low. Warm up before you exercise. Take time to cool down after exercise. Use a peak flow meter as told by your doctor. A peak flow meter is a tool that measures how well your lungs are working. Keep track of the peak flow meter's readings. Write them down. Follow your asthma action plan. This is a written plan for taking care of your asthma and treating your attacks. Make sure you get all the shots (vaccines) that your doctor recommends. Ask your doctor about a flu shot and a pneumonia shot. Keep all follow-up visits. Contact a doctor if: You have wheezing, shortness of breath, or a cough even while taking medicine to prevent attacks. The mucus you cough up (sputum) is thicker than usual. The mucus you cough up changes from clear or white to yellow, green, gray, or is bloody. You have problems from the medicine you are taking, such as: A rash. Itching. Swelling. Trouble breathing.  You need reliever medicines more than 2-3 times a week. Your peak flow reading is still at 50-79% of your personal best after following the action plan for 1 hour. You have a fever. Get help right away if: You seem to be worse and are not responding to medicine during an asthma  attack. You are short of breath even at rest. You get short of breath when doing very little activity. You have trouble eating, drinking, or talking. You have chest pain or tightness. You have a fast heartbeat. Your lips or fingernails start to turn blue. You are light-headed or dizzy, or you faint. Your peak flow is less than 50% of your personal best. You feel too tired to breathe normally. These symptoms may be an emergency. Get help right away. Call 911. Do not wait to see if the symptoms will go away. Do not drive yourself to the hospital. Summary Asthma is a long-term (chronic) condition in which the airways get tight and narrow. An asthma attack can make it hard to breathe. Asthma cannot be cured, but medicines and lifestyle changes can help control it. Make sure you understand how to avoid triggers and how and when to use your medicines. Avoid things that can cause allergy symptoms (allergens). These include animal skin flakes (dander) and pollen from trees or grass. Avoid things that pollute the air. These may include household cleaners, wood smoke, smog, or chemical odors. This information is not intended to replace advice given to you by your health care provider. Make sure you discuss any questions you have with your health care provider. Document Revised: 08/28/2021 Document Reviewed: 08/28/2021 Elsevier Patient Education  2023 [employer].

## 2022-11-12 ENCOUNTER — Telehealth: Payer: Self-pay | Admitting: Primary Care

## 2022-11-12 DIAGNOSIS — J310 Chronic rhinitis: Secondary | ICD-10-CM

## 2022-11-12 NOTE — Assessment & Plan Note (Signed)
Recommendations: - Restart Flovent inhaler- take 1-2 puffs every 12 hours as needed for cough (use with spacer to help with voice hoarseness, rinse mouth after use) - Continue flonase nasal spray daily - Continue reflux medication as directed  - Take Claritin 10mg  daily as needed for allergy symptoms - Call if cough does not improve   Rx: - [facility]  Follow-up: - 6 months with Dr. or sooner if needed

## 2022-11-12 NOTE — Telephone Encounter (Signed)
Pt. Was given 3 meds scripts on last visit and only one was called in. Needs inhaler more than the other 2. Pls call @ [phone]. Perhaps ins co has changed their policy?

## 2022-11-13 MED ORDER — ALBUTEROL SULFATE HFA 108 (90 BASE) MCG/ACT IN AERS: INHALATION_SPRAY | RESPIRATORY_TRACT | 4 refills | Status: DC

## 2022-11-13 MED ORDER — FLUTICASONE PROPIONATE 50 MCG/ACT NA SUSP: 1.0000 | Freq: Every day | NASAL | 4 refills | Status: AC

## 2022-11-13 NOTE — Telephone Encounter (Signed)
Called and went over with patient that she was needing albuterol and flonase sent in. Nothing further needed

## 2022-11-14 ENCOUNTER — Other Ambulatory Visit: Payer: Self-pay | Admitting: Cardiology

## 2022-11-22 ENCOUNTER — Other Ambulatory Visit: Payer: Self-pay | Admitting: *Deleted

## 2022-11-22 MED ORDER — PROVENTIL HFA 108 (90 BASE) MCG/ACT IN AERS: 2.0000 | INHALATION_SPRAY | Freq: Four times a day (QID) | RESPIRATORY_TRACT | 3 refills | Status: DC | PRN

## 2022-11-27 ENCOUNTER — Telehealth: Payer: Self-pay | Admitting: Primary Care

## 2022-11-27 MED ORDER — PROAIR RESPICLICK 108 (90 BASE) MCG/ACT IN AEPB: 2.0000 | INHALATION_SPRAY | Freq: Four times a day (QID) | RESPIRATORY_TRACT | 3 refills | Status: DC | PRN

## 2022-11-27 NOTE — Telephone Encounter (Signed)
New Rx has been sent to pharmacy for pt's rescue inhaler. Nothing further needed.

## 2022-11-28 ENCOUNTER — Telehealth: Payer: Self-pay | Admitting: Primary Care

## 2022-11-28 ENCOUNTER — Other Ambulatory Visit: Payer: Self-pay | Admitting: *Deleted

## 2022-11-28 DIAGNOSIS — I712 Thoracic aortic aneurysm, without rupture, unspecified: Secondary | ICD-10-CM | POA: Diagnosis not present

## 2022-11-28 DIAGNOSIS — K219 Gastro-esophageal reflux disease without esophagitis: Secondary | ICD-10-CM | POA: Diagnosis not present

## 2022-11-28 DIAGNOSIS — I700 Atherosclerosis of aorta: Secondary | ICD-10-CM | POA: Diagnosis not present

## 2022-11-28 DIAGNOSIS — J4530 Mild persistent asthma, uncomplicated: Secondary | ICD-10-CM | POA: Diagnosis not present

## 2022-11-28 DIAGNOSIS — Z0000 Encounter for general adult medical examination without abnormal findings: Secondary | ICD-10-CM | POA: Diagnosis not present

## 2022-11-28 DIAGNOSIS — Z23 Encounter for immunization: Secondary | ICD-10-CM | POA: Diagnosis not present

## 2022-11-28 DIAGNOSIS — I10 Essential (primary) hypertension: Secondary | ICD-10-CM | POA: Diagnosis not present

## 2022-11-28 DIAGNOSIS — E7800 Pure hypercholesterolemia, unspecified: Secondary | ICD-10-CM | POA: Diagnosis not present

## 2022-11-28 MED ORDER — ALBUTEROL SULFATE HFA 108 (90 BASE) MCG/ACT IN AERS: 2.0000 | INHALATION_SPRAY | Freq: Four times a day (QID) | RESPIRATORY_TRACT | 3 refills | Status: DC | PRN

## 2022-11-28 NOTE — Telephone Encounter (Signed)
Called and spoke with Corrie Dandy at [facility]. She stated that a RX had been sent to the pharmacy for Proventil. Per Corrie Dandy, Proventil has been discontinued since 2021. She wanted to know if something else could be sent in for her.   I advised her that a new RX had been sent over this morning for Ventolin. She reviewed the patient's chart and was able to find the RX. She will go ahead and process the RX.   Nothing further needed at time of call.

## 2022-12-18 DIAGNOSIS — H40013 Open angle with borderline findings, low risk, bilateral: Secondary | ICD-10-CM | POA: Diagnosis not present

## 2022-12-18 DIAGNOSIS — H26491 Other secondary cataract, right eye: Secondary | ICD-10-CM | POA: Diagnosis not present

## 2022-12-18 DIAGNOSIS — Z961 Presence of intraocular lens: Secondary | ICD-10-CM | POA: Diagnosis not present

## 2022-12-18 DIAGNOSIS — H18413 Arcus senilis, bilateral: Secondary | ICD-10-CM | POA: Diagnosis not present

## 2022-12-18 DIAGNOSIS — H26493 Other secondary cataract, bilateral: Secondary | ICD-10-CM | POA: Diagnosis not present

## 2023-01-08 ENCOUNTER — Encounter: Payer: Self-pay | Admitting: Cardiology

## 2023-01-08 ENCOUNTER — Ambulatory Visit: Payer: Medicare Other | Attending: Cardiology | Admitting: Cardiology

## 2023-01-08 VITALS — BP 124/70 | HR 69 | Ht 69.0 in | Wt 193.6 lb

## 2023-01-08 DIAGNOSIS — I10 Essential (primary) hypertension: Secondary | ICD-10-CM

## 2023-01-08 DIAGNOSIS — I77810 Thoracic aortic ectasia: Secondary | ICD-10-CM

## 2023-01-08 DIAGNOSIS — R209 Unspecified disturbances of skin sensation: Secondary | ICD-10-CM | POA: Diagnosis not present

## 2023-01-08 DIAGNOSIS — E785 Hyperlipidemia, unspecified: Secondary | ICD-10-CM

## 2023-01-08 DIAGNOSIS — I359 Nonrheumatic aortic valve disorder, unspecified: Secondary | ICD-10-CM | POA: Diagnosis not present

## 2023-01-08 NOTE — Progress Notes (Signed)
Primary Care Provider: Orpah Melter, [place] Cardiologist: Glenetta Hew, MD Electrophysiologist: None  Referring Provider: Orpah Melter, MD  Clinic Note: Chief Complaint  Patient presents with   Follow-up    Doing well.  No complaints.   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  Last check was in 2020.  Due for follow-up study.  Will recheck CTA Chest Aorta.-She will need chemistry panel preprocedure.  If this STEMI is stable, I think we can probably go to taking it every 4-5 years.      Relevant Orders   CT ANGIO CHEST AORTA W/CM & OR WO/CM   Basic metabolic panel   Essential hypertension (Chronic)    BP doing much better on current dose of 25 mg HCTZ.  Tolerating it well.  Renal function stable.      Dyslipidemia, goal LDL below 100 (Chronic)    Labs look great on current dose of rosuvastatin.  Tolerating it well.  No myalgias.  She is well within goal with a LDL of 71 and no documented CAD.      Aortic valve disease (Chronic)    Most recent echo in 2020 showed normal aortic valve.  No evidence of bicuspid structure.  No aortic stenosis or even sclerosis.  I suspect he actually has some sclerosis Nestl due to the murmur.  In the absence of any change in the murmur, I would not reassess.      Relevant Orders   Basic metabolic panel     Other   Bilateral cold feet    Does not sound like she has got Raynaud's type symptoms.  She is concerned about her vascular disease and I think is reasonable to do baseline screening on her since her sister had some vascular disease issues.  She also has the "dilation of the thoracic aorta ".  We talked about how to best evaluate this and I think our vascular screening testing would probably be reasonable as this provides evaluation of both  peripheral arterial, abdominal aortic and carotid artery flow.      Relevant Orders   EKG 12-Lead (Completed)   VAS US VASCUSCREEN   CT ANGIO CHEST AORTA W/CM & OR WO/CM   ===================================  HPI:    [name] is a 72 y.o. female with a PMH below who presents today for annual follow-up.[name] was last seen on 12/06/2021: Doing well but having occasional palpitations here and there but minimal.  BP slightly higher than previous visits, and actually lower than home pressures.  Otherwise doing well.  No complaints.  Tolerating medications.  No active cardiac symptoms. We increased HCTZ to full 25 mg daily.  Recent Hospitalizations: n/a  Reviewed  CV studies:    The following studies were reviewed today: (if available, images/films reviewed: From Epic Chart or Care Everywhere) N/a:  Interval History:   [name] returns here today for annual follow-up overall doing pretty well herself. Patient notices that her feet and hands stay cold.  She is concerned about her peripheral vascular circulation.  She is also concerned because her sister recently had a brain bleed and she wants to make sure she does not have any Promus chronic disease.  Otherwise from a cardiac standpoint she is feeling fine.  She has no more flutter sensations.  Her blood pressure has been doing much better.  She is tolerating her medications without any difficulty.  She has got no active cardiac symptoms.  Staying active and exercising is much as she can, but has been little bit limited because of having a go help her sister out.  CV Review of Symptoms (Summary): no chest pain or dyspnea on exertion positive for - very rare palpitations; cold hands and feet negative for - edema, orthopnea, paroxysmal nocturnal dyspnea, rapid heart rate, shortness of breath, or lightheadedness or dizziness, syncope/near syncope or TIA/amaurosis fugax, claudication  REVIEWED OF SYSTEMS    Pertinent symptoms noted above. Review of Systems  Constitutional:  Negative for malaise/fatigue and weight loss.  HENT:  Negative for congestion.   Respiratory:  Negative for cough and shortness of breath.   Gastrointestinal:  Negative for blood in stool and melena.  Genitourinary:  Negative for hematuria.  Musculoskeletal:  Positive for joint pain (Mostly her hand joints). Negative for myalgias.  Neurological:  Positive for tingling (With cold fingers and toes). Negative for dizziness, focal weakness and headaches.  Psychiatric/Behavioral: Negative.     I have reviewed and (if needed) personally updated the patient's problem list, medications, allergies, past medical and surgical history, social and family history.   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 ? GRII DD w/ Nl LA size (does not correlate!), Nl RA size.  AoV ~ cannot exclude bicuspid, No AS or sclerosis.  Mild dilation of ascending aorta-4 mm.;; B) 12/2019: EF 55 to 60%.  No R WMA.  Normal D Fxn.  Mild LA dilation.  Normal aortic valve.  Stable aortic dilation-39 mm   XI ROBOTIC ASSISTED VENTRAL HERNIA N/A 12/13/2020   Procedure: ROBOTIC INCISIONAL HERNIA REPAIR WITH MESH;  Surgeon: Ralene Ok, MD;  Location: [facility];  Service: General;  Laterality: N/A;    MEDICATIONS/ALLERGIES   Current Meds  Medication Sig   albuterol (VENTOLIN HFA) 108 (90 Base) MCG/ACT inhaler Inhale 2 puffs into the lungs every 6 (six) hours as needed for wheezing or shortness of breath.   Ascorbic Acid (VITAMIN C PO) Take 1 tablet by mouth  daily.    b complex vitamins tablet Take 1 tablet by mouth daily.   CALCIUM-MAGNESIUM PO Take 1 tablet by mouth daily.   Cholecalciferol (VITAMIN D) 50 MCG (2000 UT) tablet Take 2,000 Units by mouth daily.   famotidine-calcium carbonate-magnesium hydroxide (PEPCID COMPLETE) 10-800-165 MG chewable tablet Chew 1 tablet by mouth at bedtime.   FIBER DIET TABS Take 1 tablet by mouth daily.   fluticasone (FLONASE) 50 MCG/ACT nasal spray Place 1 spray into both nostrils daily.   hydrochlorothiazide (HYDRODIURIL) 25 MG tablet TAKE 1 TABLET BY MOUTH DAILY   LECITHIN PO Take 1 tablet by mouth daily.   loratadine (CLARITIN) 10 MG tablet Take 10 mg by mouth daily as needed for allergies.   Misc Natural Products (GLUCOSAMINE CHONDROITIN TRIPLE) TABS Take 1 tablet by mouth daily.   Multiple Vitamin (MULTIVITAMIN) capsule Take 1 capsule by mouth daily.   omeprazole (PRILOSEC) 20 MG capsule Take 20 mg by mouth daily.   Probiotic Product (PROBIOTIC PO) Take 1 capsule by mouth daily.   RESTASIS 0.05 % ophthalmic emulsion Place 1 drop into both eyes 2 (two) times daily.    rosuvastatin (CRESTOR) 10 MG tablet Take 10 mg by mouth every evening.    Allergies  Allergen Reactions    Other Cough    Animal dander    Dulera [Mometasone Furo-Formoterol Fum] Palpitations    SOCIAL HISTORY/FAMILY HISTORY   Reviewed in Epic:  Pertinent findings:  Social History   Tobacco Use   Smoking status: Former    Packs/day: 1.00    Years: 5.00    Total pack years: 5.00    Types: Cigarettes    Quit date: 12/03/1973    Years since quitting: 49.1   Smokeless tobacco: Never  Vaping Use   Vaping Use: Never used  Substance Use Topics   Alcohol use: Yes    Comment: wine beer and liquor on occasion    Drug use: No   Social History   Social History Narrative   Not on file    OBJCTIVE -PE, EKG, labs   Wt Readings from Last 3 Encounters:  01/08/23 193 lb 9.6 oz (87.8 kg)  11/09/22 189 lb (85.7 kg)  12/06/21 192 lb (87.1 kg)    Physical Exam: BP 124/70   Pulse 69   Ht 5' 9"$  (1.753 m)   Wt 193 lb 9.6 oz (87.8 kg)   LMP  (LMP Unknown)   BMI 28.59 kg/m  Physical Exam Vitals reviewed.  Constitutional:      General: She is not in acute distress.    Appearance: Normal appearance. She is normal weight. She is not ill-appearing or toxic-appearing.  HENT:     Head: Normocephalic and atraumatic.  Neck:     Vascular: No carotid bruit or JVD.  Cardiovascular:     Rate and Rhythm: Normal rate and regular rhythm. No extrasystoles are present.    Chest Wall: PMI is not displaced.     Pulses: Normal pulses and intact distal pulses.     Heart sounds: S1 normal and S2 normal. Murmur (1/6 SEM RUSB-> neck) heard.     No friction rub. No gallop.  Pulmonary:     Effort: Pulmonary effort is normal. No respiratory distress.     Breath sounds: Normal breath sounds. No wheezing, rhonchi or rales.  Musculoskeletal:        General: No swelling. Normal range of motion.     Cervical back: Normal range of motion and neck supple.  Skin:  General: Skin is warm and dry.  Neurological:     General: No focal deficit present.     Mental Status: She is alert and oriented to person, place,  and time.     Cranial Nerves: No cranial nerve deficit.     Gait: Gait normal.  Psychiatric:        Mood and Affect: Mood normal.        Behavior: Behavior normal.        Thought Content: Thought content normal.        Judgment: Judgment normal.     Adult ECG Report  Rate: 69 ;  Rhythm: normal sinus rhythm and normal axis, intervals and durations. ;   Narrative Interpretation: Normal  Recent Labs:  11/28/2022 TC 153, TG 95, HDL 64, LDL 71. Cr 0.79, K+ 4.5      Latest Ref Rng & Units 12/07/2020   12:00 PM 10/29/2018    3:39 AM 10/24/2018   11:30 AM  CBC  WBC 4.0 - 10.5 K/uL 11.6  16.6  10.1   Hemoglobin 12.0 - 15.0 g/dL 14.7  11.6  12.9   Hematocrit 36.0 - 46.0 % 45.9  37.6  41.3   Platelets 150 - 400 K/uL 281  234  277     No results found for: "HGBA1C" No results found for: "TSH"  ================================================== I spent a total of 24 minutes with the patient spent in direct patient consultation.  Additional time spent with chart review  / charting (studies, outside notes, etc): 18 min Total Time: 42 min  Current medicines are reviewed at length with the patient today.  (+/- concerns) n/a  Notice: This dictation was prepared with Dragon dictation along with smart phrase technology. Any transcriptional errors that result from this process are unintentional and may not be corrected upon review.  Studies Ordered:   Orders Placed This Encounter  Procedures   CT ANGIO CHEST AORTA W/CM & OR WO/CM   Basic metabolic panel   EKG [id number]   VAS US VASCUSCREEN   No orders of the defined types were placed in this encounter.   Patient Instructions / Medication Changes & Studies & Tests Ordered   Patient Instructions  Medication Instructions:  No change   *If you need a refill on your cardiac medications before your next appointment, please call your pharmacy*   Lab Work: [place]  BMP  If you have labs (blood work) drawn today and your tests are  completely normal, you will receive your results only by: [place] (if you have MyChart) OR A paper copy in the mail If you have any lab test that is abnormal or we need to change your treatment, we will call you to review the results.   Testing/Procedures: Will be schedule  in Dec 2024 please have labs done a week prior to CT scan  Non-Cardiac CT Angiography (CTA), is a special type of CT scan that uses a computer to produce multi-dimensional views of major blood vessels  ( aorta) throughout the body. In CT angiography, a contrast material is injected through an IV to help visualize the blood vessels    Follow-Up: At [facility], you and your health needs are our priority.  As part of our continuing mission to provide you with exceptional heart care, we have created designated Provider Care Teams.  These Care Teams include your primary Cardiologist (physician) and Advanced Practice Providers (APPs -  Physician Assistants and Nurse Practitioners) who all work together to  provide you with the care you need, when you need it.     Your next appointment:   12 month(s)  The format for your next appointment:   In Person  Provider:   Glenetta Hew, MD      Leonie Man, MD, MS Glenetta Hew, M.D., M.S. Interventional Cardiologist  [place]  Pager # [phone] Phone # [phone] [address]. [place], [place] [address]   Thank you for choosing [facility]!!

## 2023-01-08 NOTE — Patient Instructions (Addendum)
Medication Instructions:  No change   *If you need a refill on your cardiac medications before your next appointment, please call your pharmacy*   Lab Work: [place]  BMP  If you have labs (blood work) drawn today and your tests are completely normal, you will receive your results only by: [place] (if you have MyChart) OR A paper copy in the mail If you have any lab test that is abnormal or we need to change your treatment, we will call you to review the results.   Testing/Procedures: Will be schedule  in Dec 2024 please have labs done a week prior to CT scan  Non-Cardiac CT Angiography (CTA), is a special type of CT scan that uses a computer to produce multi-dimensional views of major blood vessels  ( aorta) throughout the body. In CT angiography, a contrast material is injected through an IV to help visualize the blood vessels    Follow-Up: At [facility], you and your health needs are our priority.  As part of our continuing mission to provide you with exceptional heart care, we have created designated Provider Care Teams.  These Care Teams include your primary Cardiologist (physician) and Advanced Practice Providers (APPs -  Physician Assistants and Nurse Practitioners) who all work together to provide you with the care you need, when you need it.     Your next appointment:   12 month(s)  The format for your next appointment:   In Person  Provider:   Glenetta Hew, MD

## 2023-01-11 DIAGNOSIS — U071 COVID-19: Secondary | ICD-10-CM | POA: Diagnosis not present

## 2023-01-12 ENCOUNTER — Encounter: Payer: Self-pay | Admitting: Cardiology

## 2023-01-12 NOTE — Assessment & Plan Note (Signed)
Mild thoracic aortic dilation.  Last check was in 2020.  Due for follow-up study.  Will recheck CTA Chest Aorta.-She will need chemistry panel preprocedure.  If this STEMI is stable, I think we can probably go to taking it every 4-5 years.

## 2023-01-12 NOTE — Assessment & Plan Note (Signed)
Labs look great on current dose of rosuvastatin.  Tolerating it well.  No myalgias.  She is well within goal with a LDL of 71 and no documented CAD.

## 2023-01-12 NOTE — Assessment & Plan Note (Signed)
Does not sound like she has got Raynaud's type symptoms.  She is concerned about her vascular disease and I think is reasonable to do baseline screening on her since her sister had some vascular disease issues.  She also has the "dilation of the thoracic aorta ".  We talked about how to best evaluate this and I think our vascular screening testing would probably be reasonable as this provides evaluation of both peripheral arterial, abdominal aortic and carotid artery flow.

## 2023-01-12 NOTE — Assessment & Plan Note (Signed)
BP doing much better on current dose of 25 mg HCTZ.  Tolerating it well.  Renal function stable.

## 2023-01-12 NOTE — Assessment & Plan Note (Addendum)
Most recent echo in 2020 showed normal aortic valve.  No evidence of bicuspid structure.  No aortic stenosis or even sclerosis.  I suspect he actually has some sclerosis Nestl due to the murmur.  In the absence of any change in the murmur, I would not reassess.

## 2023-02-06 ENCOUNTER — Ambulatory Visit (HOSPITAL_COMMUNITY)
Admission: RE | Admit: 2023-02-06 | Discharge: 2023-02-06 | Disposition: A | Discharge status: Home / Self Care | Payer: Medicare Other | Source: Ambulatory Visit | Attending: Internal Medicine | Admitting: Internal Medicine

## 2023-02-06 DIAGNOSIS — R209 Unspecified disturbances of skin sensation: Secondary | ICD-10-CM

## 2023-02-11 DIAGNOSIS — Z1231 Encounter for screening mammogram for malignant neoplasm of breast: Secondary | ICD-10-CM | POA: Diagnosis not present

## 2023-02-12 DIAGNOSIS — H5203 Hypermetropia, bilateral: Secondary | ICD-10-CM | POA: Diagnosis not present

## 2023-02-12 DIAGNOSIS — H26492 Other secondary cataract, left eye: Secondary | ICD-10-CM | POA: Diagnosis not present

## 2023-02-12 DIAGNOSIS — H524 Presbyopia: Secondary | ICD-10-CM | POA: Diagnosis not present

## 2023-02-12 DIAGNOSIS — H52223 Regular astigmatism, bilateral: Secondary | ICD-10-CM | POA: Diagnosis not present

## 2023-02-14 ENCOUNTER — Other Ambulatory Visit: Payer: Self-pay | Admitting: *Deleted

## 2023-02-14 DIAGNOSIS — I77810 Thoracic aortic ectasia: Secondary | ICD-10-CM

## 2023-02-14 DIAGNOSIS — I359 Nonrheumatic aortic valve disorder, unspecified: Secondary | ICD-10-CM

## 2023-02-14 NOTE — Progress Notes (Signed)
Lab Order placed for BMP to be done in Nov or first part of Dec 2024 . She will need to have lab prior to CT scan  schedule on 11/08/23. Patient is aware - non fasting .may come btwn the hours of 8 am to 4 pm

## 2023-02-21 DIAGNOSIS — H52223 Regular astigmatism, bilateral: Secondary | ICD-10-CM | POA: Diagnosis not present

## 2023-02-21 DIAGNOSIS — H5203 Hypermetropia, bilateral: Secondary | ICD-10-CM | POA: Diagnosis not present

## 2023-02-21 DIAGNOSIS — H26492 Other secondary cataract, left eye: Secondary | ICD-10-CM | POA: Diagnosis not present

## 2023-02-21 DIAGNOSIS — H524 Presbyopia: Secondary | ICD-10-CM | POA: Diagnosis not present

## 2023-02-28 ENCOUNTER — Encounter (HOSPITAL_COMMUNITY): Payer: Self-pay | Admitting: Cardiology

## 2023-03-05 DIAGNOSIS — L304 Erythema intertrigo: Secondary | ICD-10-CM | POA: Diagnosis not present

## 2023-03-05 DIAGNOSIS — D225 Melanocytic nevi of trunk: Secondary | ICD-10-CM | POA: Diagnosis not present

## 2023-03-05 DIAGNOSIS — L814 Other melanin hyperpigmentation: Secondary | ICD-10-CM | POA: Diagnosis not present

## 2023-03-05 DIAGNOSIS — L821 Other seborrheic keratosis: Secondary | ICD-10-CM | POA: Diagnosis not present

## 2023-04-09 DIAGNOSIS — H40013 Open angle with borderline findings, low risk, bilateral: Secondary | ICD-10-CM | POA: Diagnosis not present

## 2023-04-25 DIAGNOSIS — M9902 Segmental and somatic dysfunction of thoracic region: Secondary | ICD-10-CM | POA: Diagnosis not present

## 2023-04-25 DIAGNOSIS — M62830 Muscle spasm of back: Secondary | ICD-10-CM | POA: Diagnosis not present

## 2023-04-25 DIAGNOSIS — M47816 Spondylosis without myelopathy or radiculopathy, lumbar region: Secondary | ICD-10-CM | POA: Diagnosis not present

## 2023-04-25 DIAGNOSIS — M9903 Segmental and somatic dysfunction of lumbar region: Secondary | ICD-10-CM | POA: Diagnosis not present

## 2023-04-26 DIAGNOSIS — M9902 Segmental and somatic dysfunction of thoracic region: Secondary | ICD-10-CM | POA: Diagnosis not present

## 2023-04-26 DIAGNOSIS — M47816 Spondylosis without myelopathy or radiculopathy, lumbar region: Secondary | ICD-10-CM | POA: Diagnosis not present

## 2023-04-26 DIAGNOSIS — M9903 Segmental and somatic dysfunction of lumbar region: Secondary | ICD-10-CM | POA: Diagnosis not present

## 2023-04-26 DIAGNOSIS — M62830 Muscle spasm of back: Secondary | ICD-10-CM | POA: Diagnosis not present

## 2023-05-01 DIAGNOSIS — M9903 Segmental and somatic dysfunction of lumbar region: Secondary | ICD-10-CM | POA: Diagnosis not present

## 2023-05-01 DIAGNOSIS — M47816 Spondylosis without myelopathy or radiculopathy, lumbar region: Secondary | ICD-10-CM | POA: Diagnosis not present

## 2023-05-01 DIAGNOSIS — M9902 Segmental and somatic dysfunction of thoracic region: Secondary | ICD-10-CM | POA: Diagnosis not present

## 2023-05-01 DIAGNOSIS — M62830 Muscle spasm of back: Secondary | ICD-10-CM | POA: Diagnosis not present

## 2023-05-07 DIAGNOSIS — M47816 Spondylosis without myelopathy or radiculopathy, lumbar region: Secondary | ICD-10-CM | POA: Diagnosis not present

## 2023-05-07 DIAGNOSIS — M9903 Segmental and somatic dysfunction of lumbar region: Secondary | ICD-10-CM | POA: Diagnosis not present

## 2023-05-07 DIAGNOSIS — M62830 Muscle spasm of back: Secondary | ICD-10-CM | POA: Diagnosis not present

## 2023-05-07 DIAGNOSIS — M9902 Segmental and somatic dysfunction of thoracic region: Secondary | ICD-10-CM | POA: Diagnosis not present

## 2023-05-09 DIAGNOSIS — M47816 Spondylosis without myelopathy or radiculopathy, lumbar region: Secondary | ICD-10-CM | POA: Diagnosis not present

## 2023-05-09 DIAGNOSIS — M62830 Muscle spasm of back: Secondary | ICD-10-CM | POA: Diagnosis not present

## 2023-05-09 DIAGNOSIS — M9903 Segmental and somatic dysfunction of lumbar region: Secondary | ICD-10-CM | POA: Diagnosis not present

## 2023-05-09 DIAGNOSIS — M9902 Segmental and somatic dysfunction of thoracic region: Secondary | ICD-10-CM | POA: Diagnosis not present

## 2023-05-28 DIAGNOSIS — M47816 Spondylosis without myelopathy or radiculopathy, lumbar region: Secondary | ICD-10-CM | POA: Diagnosis not present

## 2023-05-28 DIAGNOSIS — M9902 Segmental and somatic dysfunction of thoracic region: Secondary | ICD-10-CM | POA: Diagnosis not present

## 2023-05-28 DIAGNOSIS — M62830 Muscle spasm of back: Secondary | ICD-10-CM | POA: Diagnosis not present

## 2023-05-28 DIAGNOSIS — M9903 Segmental and somatic dysfunction of lumbar region: Secondary | ICD-10-CM | POA: Diagnosis not present

## 2023-05-31 DIAGNOSIS — M47816 Spondylosis without myelopathy or radiculopathy, lumbar region: Secondary | ICD-10-CM | POA: Diagnosis not present

## 2023-05-31 DIAGNOSIS — M62830 Muscle spasm of back: Secondary | ICD-10-CM | POA: Diagnosis not present

## 2023-05-31 DIAGNOSIS — M9903 Segmental and somatic dysfunction of lumbar region: Secondary | ICD-10-CM | POA: Diagnosis not present

## 2023-05-31 DIAGNOSIS — M9902 Segmental and somatic dysfunction of thoracic region: Secondary | ICD-10-CM | POA: Diagnosis not present

## 2023-06-04 DIAGNOSIS — M62830 Muscle spasm of back: Secondary | ICD-10-CM | POA: Diagnosis not present

## 2023-06-04 DIAGNOSIS — M47816 Spondylosis without myelopathy or radiculopathy, lumbar region: Secondary | ICD-10-CM | POA: Diagnosis not present

## 2023-06-04 DIAGNOSIS — M9903 Segmental and somatic dysfunction of lumbar region: Secondary | ICD-10-CM | POA: Diagnosis not present

## 2023-06-04 DIAGNOSIS — M9902 Segmental and somatic dysfunction of thoracic region: Secondary | ICD-10-CM | POA: Diagnosis not present

## 2023-06-10 DIAGNOSIS — K5904 Chronic idiopathic constipation: Secondary | ICD-10-CM | POA: Diagnosis not present

## 2023-06-12 DIAGNOSIS — M47816 Spondylosis without myelopathy or radiculopathy, lumbar region: Secondary | ICD-10-CM | POA: Diagnosis not present

## 2023-06-12 DIAGNOSIS — M9903 Segmental and somatic dysfunction of lumbar region: Secondary | ICD-10-CM | POA: Diagnosis not present

## 2023-06-12 DIAGNOSIS — M62830 Muscle spasm of back: Secondary | ICD-10-CM | POA: Diagnosis not present

## 2023-06-12 DIAGNOSIS — M9902 Segmental and somatic dysfunction of thoracic region: Secondary | ICD-10-CM | POA: Diagnosis not present

## 2023-06-18 DIAGNOSIS — M62830 Muscle spasm of back: Secondary | ICD-10-CM | POA: Diagnosis not present

## 2023-06-18 DIAGNOSIS — M47816 Spondylosis without myelopathy or radiculopathy, lumbar region: Secondary | ICD-10-CM | POA: Diagnosis not present

## 2023-06-18 DIAGNOSIS — M9902 Segmental and somatic dysfunction of thoracic region: Secondary | ICD-10-CM | POA: Diagnosis not present

## 2023-06-18 DIAGNOSIS — M9903 Segmental and somatic dysfunction of lumbar region: Secondary | ICD-10-CM | POA: Diagnosis not present

## 2023-06-26 DIAGNOSIS — M47816 Spondylosis without myelopathy or radiculopathy, lumbar region: Secondary | ICD-10-CM | POA: Diagnosis not present

## 2023-06-26 DIAGNOSIS — M62830 Muscle spasm of back: Secondary | ICD-10-CM | POA: Diagnosis not present

## 2023-06-26 DIAGNOSIS — M9902 Segmental and somatic dysfunction of thoracic region: Secondary | ICD-10-CM | POA: Diagnosis not present

## 2023-06-26 DIAGNOSIS — M9903 Segmental and somatic dysfunction of lumbar region: Secondary | ICD-10-CM | POA: Diagnosis not present

## 2023-08-06 ENCOUNTER — Other Ambulatory Visit (HOSPITAL_BASED_OUTPATIENT_CLINIC_OR_DEPARTMENT_OTHER): Payer: Medicare Other

## 2023-08-06 ENCOUNTER — Telehealth: Payer: Self-pay | Admitting: Cardiology

## 2023-08-06 ENCOUNTER — Encounter (HOSPITAL_BASED_OUTPATIENT_CLINIC_OR_DEPARTMENT_OTHER): Payer: Self-pay | Admitting: Family

## 2023-08-06 ENCOUNTER — Ambulatory Visit (HOSPITAL_BASED_OUTPATIENT_CLINIC_OR_DEPARTMENT_OTHER): Payer: Medicare Other | Admitting: Family

## 2023-08-06 VITALS — BP 130/87 | HR 76 | Ht 69.0 in | Wt 192.0 lb

## 2023-08-06 DIAGNOSIS — I77810 Thoracic aortic ectasia: Secondary | ICD-10-CM

## 2023-08-06 DIAGNOSIS — I10 Essential (primary) hypertension: Secondary | ICD-10-CM

## 2023-08-06 DIAGNOSIS — I491 Atrial premature depolarization: Secondary | ICD-10-CM | POA: Diagnosis not present

## 2023-08-06 DIAGNOSIS — I471 Supraventricular tachycardia, unspecified: Secondary | ICD-10-CM | POA: Diagnosis not present

## 2023-08-06 MED ORDER — METOPROLOL TARTRATE 25 MG PO TABS
25.0000 mg | ORAL_TABLET | ORAL | 1 refills | Status: AC | PRN
Start: 2023-08-06 — End: 2024-09-09

## 2023-08-06 NOTE — Telephone Encounter (Signed)
Patient c/o Palpitations:  STAT if patient reporting lightheadedness, shortness of breath, or chest pain  How long have you had palpitations/irregular HR/ Afib? Are you having the symptoms now? off and on- more frequent in thl  Are you currently experiencing lightheadedness, SOB or CP? no  Do you have a history of afib (atrial fibrillation) or irregular heart rhythm?   Have you checked your BP or HR? (document readings if available): 138/85 this morning  Are you experiencing any other symptoms? no

## 2023-08-06 NOTE — Patient Instructions (Addendum)
Medication Instructions:  Your physician has recommended you make the following change in your medication:   START Metoprolol Tartrate 25mg  as needed for palpitations    *If you need a refill on your cardiac medications before your next appointment, please call your pharmacy*   Lab Work: Your physician recommends that you return for lab work today: thyroid panel, BMP, CBC, magnesium  If you have labs (blood work) drawn today and your tests are completely normal, you will receive your results only by: MyChart Message (if you have MyChart) OR A paper copy in the mail If you have any lab test that is abnormal or we need to change your treatment, we will call you to review the results.   Testing/Procedures: Your EKG today shows normal sinus rhythm with occasional early beat called PAC (premature atrial contraction)   Follow-Up: At [facility], you and your health needs are our priority.  As part of our continuing mission to provide you with exceptional heart care, we have created designated Provider Care Teams.  These Care Teams include your primary Cardiologist (physician) and Advanced Practice Providers (APPs -  Physician Assistants and Nurse Practitioners) who all work together to provide you with the care you need, when you need it.  We recommend signing up for the patient portal called "MyChart".  Sign up information is provided on this After Visit Summary.  MyChart is used to connect with patients for Virtual Visits (Telemedicine).  Patients are able to view lab/test results, encounter notes, upcoming appointments, etc.  Non-urgent messages can be sent to your provider as well.   To learn more about what you can do with MyChart, go to [email or web address].    Your next appointment:   6-8 week(s)  Provider:   Bryan Lemma, MD  or Alver Sorrow, NP    Other Instructions   To prevent palpitations: Make sure you are adequately hydrated.  Avoid and/or limit caffeine  containing beverages like soda or tea. Exercise regularly.  Manage stress well. Some over the counter medications can cause palpitations such as Benadryl, AdvilPM, TylenolPM. Regular Advil or Tylenol do not cause palpitations.

## 2023-08-06 NOTE — Telephone Encounter (Signed)
Patient scheduled to be seen today with Gillian Shields, NP.

## 2023-08-06 NOTE — Progress Notes (Signed)
Cardiology Office Note:  .   Date:  08/06/2023  ID:  [name], DOB 12/25/1950, MRN [id number] PCP: Joycelyn Rua, MD  [facility] Providers Cardiologist:  Bryan Lemma, MD    History of Present Illness: Marland Kitchen   [name] is a 72 y.o. female mild thoracic aortic dilation, hypertension, hyperlipidemia, aortic atherosclerosis.  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  Previously 3.8 cm. Repeat CT scheduled 11/2023. Vascular screen 02/2023 with normal bilateral carotids, normal ABI bilaterally, aorta with no aneurysm or dilation.  Presents today for follow-up. Longstanding history of palpitations intermittently at night. Over the last week more significant and more frequent. Feels like a faster or more forceful heart beat. Happening throughout the day. No dyspnea, diaphoresis. Reports no recent stressors. Drinks 1-2 large cups of coffee per day and then a cup of tea in the afternoon. Otherwise drinks juice, water. Does water exercises at the [facility].   ROS: Please see the history of present illness.    All other systems reviewed and are negative.   Studies Reviewed: Marland Kitchen   EKG Interpretation Date/Time:  Tuesday August 06 2023 14:44:42 EDT Ventricular Rate:  76 PR Interval:  146 QRS Duration:  98 QT Interval:  416 QTC Calculation: 468 R Axis:   -20  Text Interpretation: Sinus rhythm with Premature atrial complexes Confirmed by Gillian Shields ([id number]) on 08/06/2023 3:00:17 PM    Cardiac Studies & Procedures       ECHOCARDIOGRAM  ECHOCARDIOGRAM COMPLETE 10/12/2020  Narrative ECHOCARDIOGRAM REPORT    Patient Name:   [name] [facility] Date of Exam: 10/12/2020 Medical Rec #:  [id number]       Height:       69.0 in Accession #:    [id number]      Weight:       197.0 lb Date of Birth:  04/13/1951       BSA:          2.053 m Patient Age:    69 years        BP:           134/86 mmHg Patient Gender: F               HR:            68 bpm. Exam Location:  [address]  Procedure: 2D Echo, Cardiac Doppler and Color Doppler  Indications:    I35.9 Aortic Valve disease  History:        Patient has prior history of Echocardiogram examinations, most recent 10/23/2019. Thoracic ascending Aortic aneurysm, Signs/Symptoms:Murmur; Risk Factors:Dyslipidemia.  Sonographer:    Clearence Ped RCS Referring Phys: 70 DAVID W HARDING  IMPRESSIONS   1. Normal GLS -17.5. Left ventricular ejection fraction, by estimation, is 55 to 60%. The left ventricle has normal function. The left ventricle has no regional wall motion abnormalities. Left ventricular diastolic parameters were normal. 2. Right ventricular systolic function is normal. The right ventricular size is normal. There is normal pulmonary artery systolic pressure. 3. Left atrial size was mildly dilated. 4. The mitral valve is normal in structure. Mild mitral valve regurgitation. No evidence of mitral stenosis. 5. The aortic valve is normal in structure. Aortic valve regurgitation is not visualized. No aortic stenosis is present. 6. Aortic dilatation noted. There is mild dilatation of the ascending aorta, measuring 39 mm. 7. The inferior vena cava is normal in size with greater than 50% respiratory variability, suggesting  right atrial pressure of 3 mmHg.  FINDINGS Left Ventricle: Normal GLS -17.5. Left ventricular ejection fraction, by estimation, is 55 to 60%. The left ventricle has normal function. The left ventricle has no regional wall motion abnormalities. The left ventricular internal cavity size was normal in size. There is no left ventricular hypertrophy. Left ventricular diastolic parameters were normal.  Right Ventricle: The right ventricular size is normal. No increase in right ventricular wall thickness. Right ventricular systolic function is normal. There is normal pulmonary artery systolic pressure. The tricuspid regurgitant velocity is 1.51 m/s,  and with an assumed right atrial pressure of 3 mmHg, the estimated right ventricular systolic pressure is 12.1 mmHg.  Left Atrium: Left atrial size was mildly dilated.  Right Atrium: Right atrial size was normal in size.  Pericardium: There is no evidence of pericardial effusion.  Mitral Valve: The mitral valve is normal in structure. There is mild thickening of the mitral valve leaflet(s). There is mild calcification of the mitral valve leaflet(s). Mild mitral valve regurgitation. No evidence of mitral valve stenosis.  Tricuspid Valve: The tricuspid valve is normal in structure. Tricuspid valve regurgitation is trivial. No evidence of tricuspid stenosis.  Aortic Valve: The aortic valve is normal in structure. Aortic valve regurgitation is not visualized. No aortic stenosis is present.  Pulmonic Valve: The pulmonic valve was normal in structure. Pulmonic valve regurgitation is not visualized. No evidence of pulmonic stenosis.  Aorta: The aortic root is normal in size and structure and aortic dilatation noted. There is mild dilatation of the ascending aorta, measuring 39 mm.  Venous: The inferior vena cava is normal in size with greater than 50% respiratory variability, suggesting right atrial pressure of 3 mmHg.  IAS/Shunts: The interatrial septum appears to be lipomatous. No atrial level shunt detected by color flow Doppler.   LEFT VENTRICLE PLAX 2D LVIDd:         5.20 cm  Diastology LVIDs:         3.40 cm  LV e' medial:    7.83 cm/s LV PW:         1.00 cm  LV E/e' medial:  6.5 LV IVS:        1.00 cm  LV e' lateral:   9.95 cm/s LVOT diam:     1.90 cm  LV E/e' lateral: 5.1 LV SV:         59 LV SV Index:   29 LVOT Area:     2.84 cm   RIGHT VENTRICLE RV Basal diam:  3.30 cm RV S prime:     13.20 cm/s TAPSE (M-mode): 2.5 cm RVSP:           12.1 mmHg  LEFT ATRIUM             Index       RIGHT ATRIUM           Index LA diam:        3.80 cm 1.85 cm/m  RA Pressure: 3.00 mmHg LA  Vol (A2C):   60.9 ml 29.67 ml/m RA Area:     12.40 cm LA Vol (A4C):   27.7 ml 13.49 ml/m RA Volume:   26.10 ml  12.71 ml/m LA Biplane Vol: 44.2 ml 21.53 ml/m AORTIC VALVE LVOT Vmax:   84.90 cm/s LVOT Vmean:  60.600 cm/s LVOT VTI:    0.208 m  AORTA Ao Root diam: 3.50 cm Ao Asc diam:  3.90 cm  MITRAL VALVE  TRICUSPID VALVE MV Area (PHT):             TR Peak grad:   9.1 mmHg MV Decel Time:             TR Vmax:        151.00 cm/s MV E velocity: 51.00 cm/s  Estimated RAP:  3.00 mmHg MV A velocity: 63.00 cm/s  RVSP:           12.1 mmHg MV E/A ratio:  0.81 SHUNTS Systemic VTI:  0.21 m Systemic Diam: 1.90 cm  Charlton Haws MD Electronically signed by Charlton Haws MD Signature Date/Time: 10/12/2020/12:24:57 PM    Final             Risk Assessment/Calculations:             Physical Exam:   VS:  BP 130/87   Pulse 76   Ht 5\' 9"  (1.753 m)   Wt 192 lb (87.1 kg)   LMP  (LMP Unknown)   SpO2 96%   BMI 28.35 kg/m    Wt Readings from Last 3 Encounters:  08/06/23 192 lb (87.1 kg)  01/08/23 193 lb 9.6 oz (87.8 kg)  11/09/22 189 lb (85.7 kg)    GEN: Well nourished, well developed in no acute distress NECK: No JVD; No carotid bruits CARDIAC: RRR, no murmurs, rubs, gallops RESPIRATORY:  Clear to auscultation without rales, wheezing or rhonchi  ABDOMEN: Soft, non-tender, non-distended EXTREMITIES:  No edema; No deformity   ASSESSMENT AND PLAN: .    Papitations - EKG today NSR with PAC. No etoh nor excessive caffeine. No recent stressors. BMP, mag, TSH, CBC today to rule out electrolyte, thyroid, or anemia as contributory. 14 day ZIO placed in clinic. Rx Metoprolol Tartrate 25mg  PRN for palpitations.   HTN - BP well controlled. Continue current antihypertensive regimen.    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        Dispo: follow up in 6-8 weeks  Signed, Alver Sorrow, NP

## 2023-08-07 ENCOUNTER — Telehealth (HOSPITAL_BASED_OUTPATIENT_CLINIC_OR_DEPARTMENT_OTHER): Payer: Self-pay

## 2023-08-07 LAB — CBC
Hematocrit: 44.2 % (ref 34.0–46.6)
Hemoglobin: 14.6 g/dL (ref 11.1–15.9)
MCH: 28 pg (ref 26.6–33.0)
MCHC: 33 g/dL (ref 31.5–35.7)
MCV: 85 fL (ref 79–97)
Platelets: 317 10*3/uL (ref 150–450)
RBC: 5.22 x10E6/uL (ref 3.77–5.28)
RDW: 13.9 % (ref 11.7–15.4)
WBC: 11.4 10*3/uL — ABNORMAL HIGH (ref 3.4–10.8)

## 2023-08-07 LAB — BASIC METABOLIC PANEL
BUN/Creatinine Ratio: 16 (ref 12–28)
BUN: 14 mg/dL (ref 8–27)
CO2: 26 mmol/L (ref 20–29)
Calcium: 9.6 mg/dL (ref 8.7–10.3)
Chloride: 102 mmol/L (ref 96–106)
Creatinine, Ser: 0.86 mg/dL (ref 0.57–1.00)
Glucose: 127 mg/dL — ABNORMAL HIGH (ref 70–99)
Potassium: 4.4 mmol/L (ref 3.5–5.2)
Sodium: 143 mmol/L (ref 134–144)
eGFR: 71 mL/min/{1.73_m2} (ref >59)

## 2023-08-07 LAB — THYROID PANEL WITH TSH
Free Thyroxine Index: 1.8 (ref 1.2–4.9)
T3 Uptake Ratio: 24 % (ref 24–39)
T4, Total: 7.5 ug/dL (ref 4.5–12.0)
TSH: 1.69 u[IU]/mL (ref 0.450–4.500)

## 2023-08-07 LAB — MAGNESIUM: Magnesium: 2.4 mg/dL — ABNORMAL HIGH (ref 1.6–2.3)

## 2023-08-07 NOTE — Telephone Encounter (Addendum)
Called results to patient and left results on VM (ok per DPR), instructions left to call office back if patient has any questions!     ----- Message from Alver Sorrow sent at 08/07/2023  9:36 AM EDT ----- Normal kidney function, potassium, thyroid. Magnesium upper limit of normal which is not of concern. CBC with no evidence of anemia nor infection.  No significant abnormalities that would contribute to palpitation.

## 2023-08-18 IMAGING — CT CT ANGIO CHEST
3 of 7 series · 18 of 46 positions shown · IV contrast (OMNIPAQUE)
Comparison: 10/20/2019

CLINICAL DATA: 70-year-old female with history of thoracic aortic
aneurysm. Follow-up study.

EXAM:
CT ANGIOGRAPHY CHEST WITH CONTRAST
TECHNIQUE: Multidetector CT imaging of the chest was performed using the
standard protocol during bolus administration of intravenous
contrast. Multiplanar CT image reconstructions and MIPs were
obtained to evaluate the vascular anatomy.
CONTRAST:  100 mL Omnipaque 350, intravenous

[Series 4: axial arterial · axial · arterial · 0.85mm/px · z∈[+1446,+1719]mm · 11 of 111 slices shown]
[im 10/111  lung]
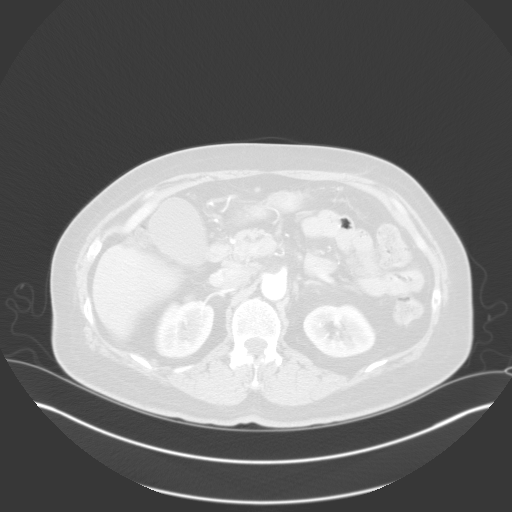
[im 19/111  soft-tissue]
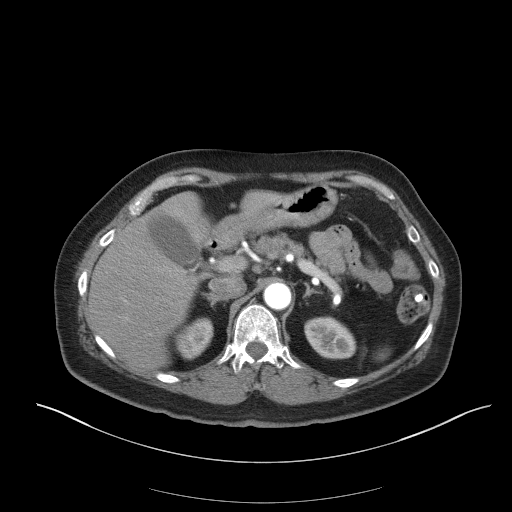
[im 28/111  lung]
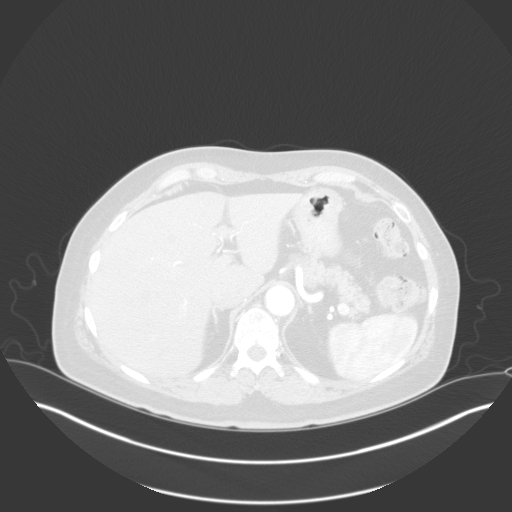
[im 37/111  soft-tissue]
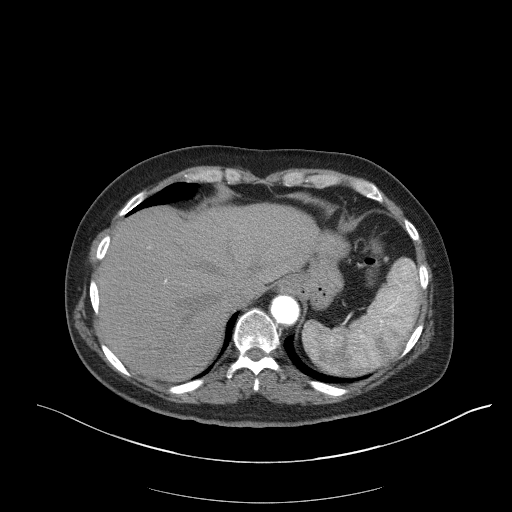
[im 46/111  lung]
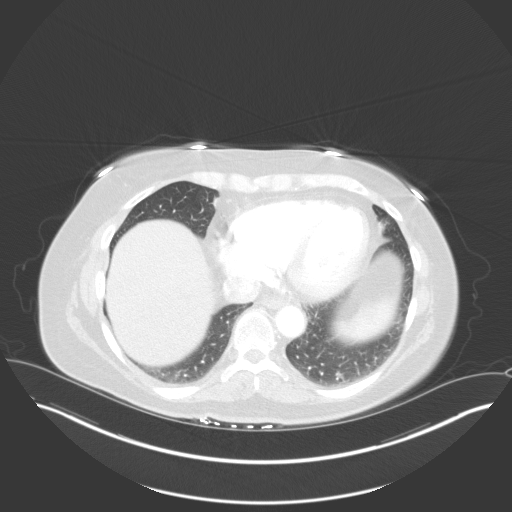
[im 56/111  soft-tissue]
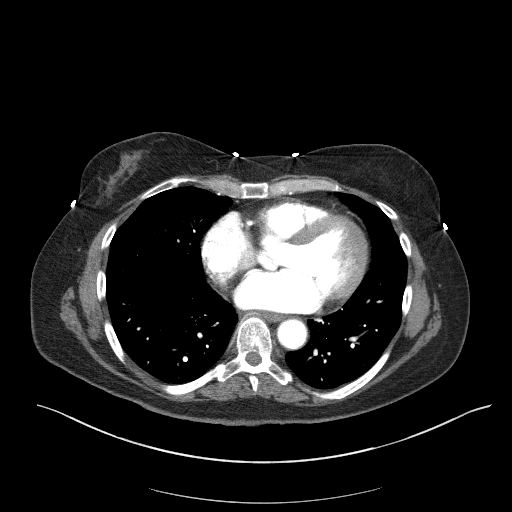
[im 65/111  lung]
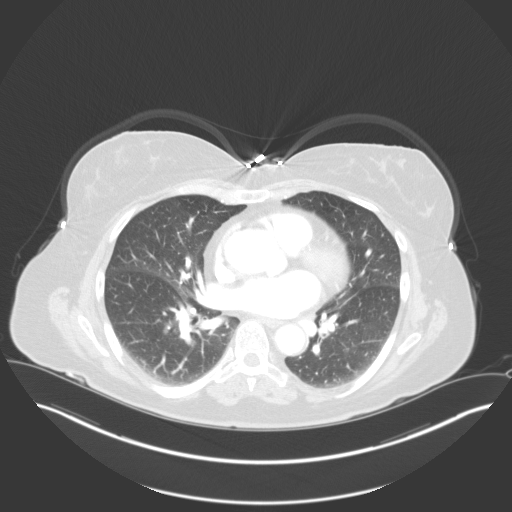
[im 74/111  soft-tissue]
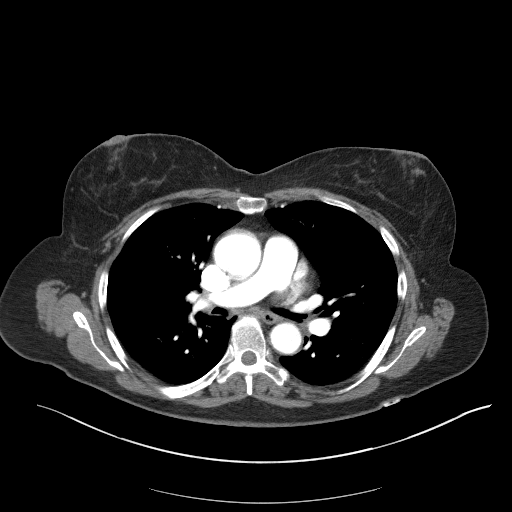
[im 83/111  lung]
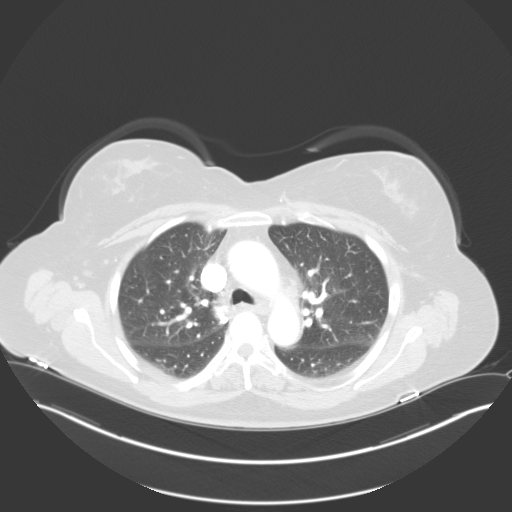
[im 92/111  soft-tissue]
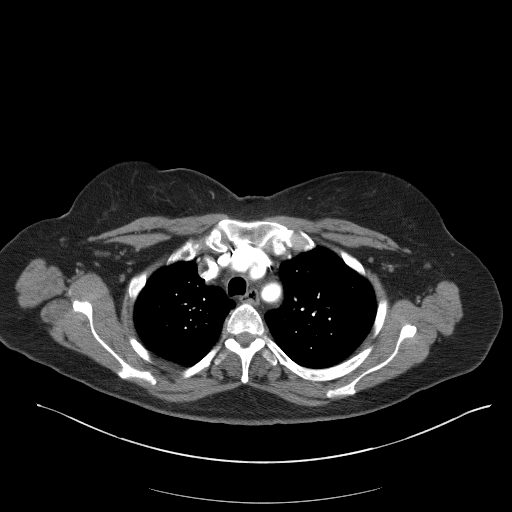
[im 101/111  lung]
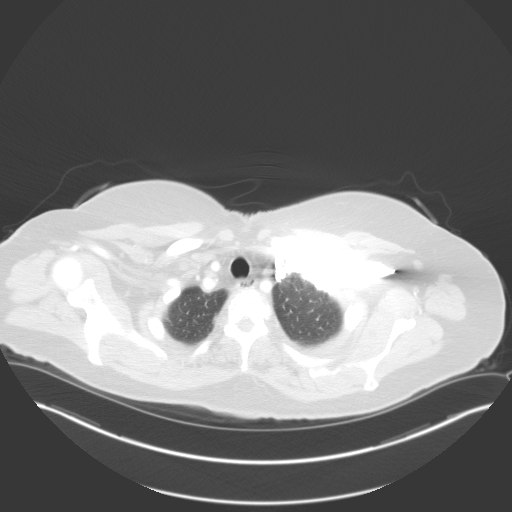

[Series 6: lung · axial · 0.85mm/px · z∈[+1500,+1596]mm · 4 of 137 slices shown]
[im 17/137  soft-tissue]
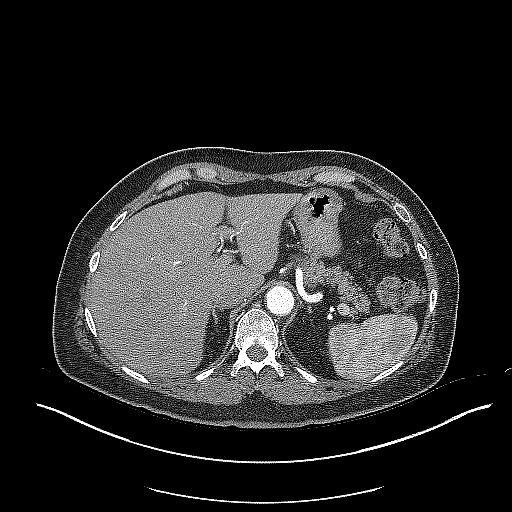
[im 33/137  soft-tissue]
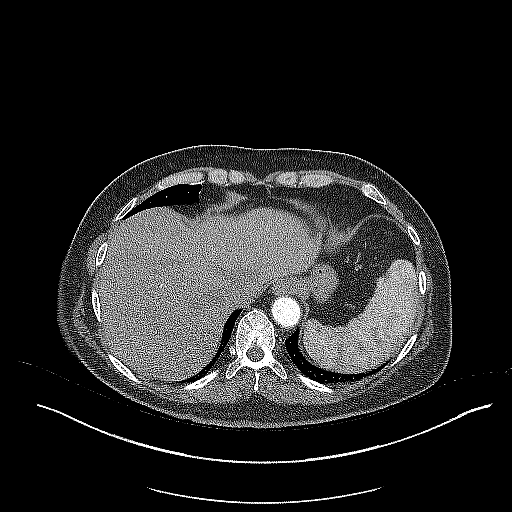
[im 49/137  soft-tissue]
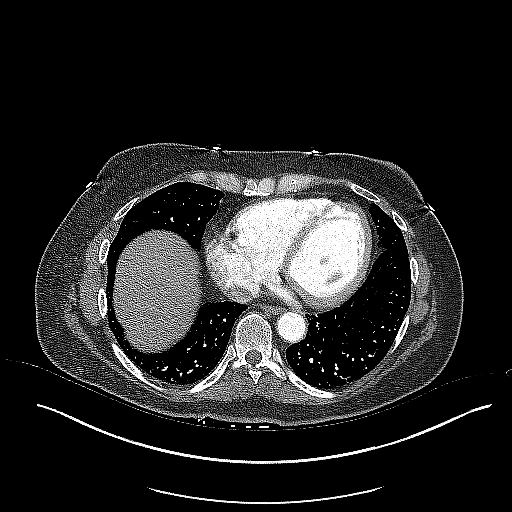
[im 65/137  soft-tissue]
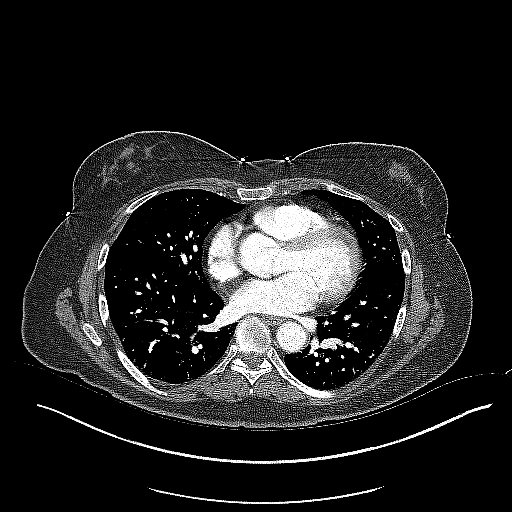

[Series 7: coronal · coronal · 0.68mm/px · 3 of 70 slices shown]
[im 18/70  soft-tissue]
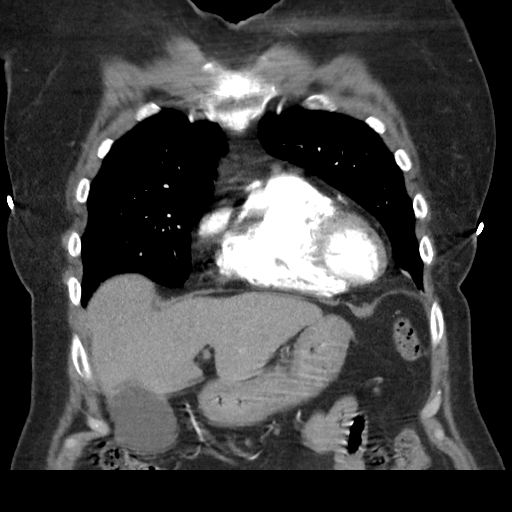
[im 35/70  soft-tissue]
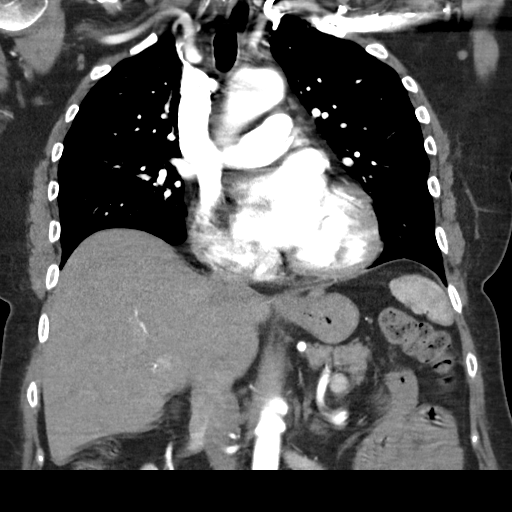
[im 52/70  soft-tissue]
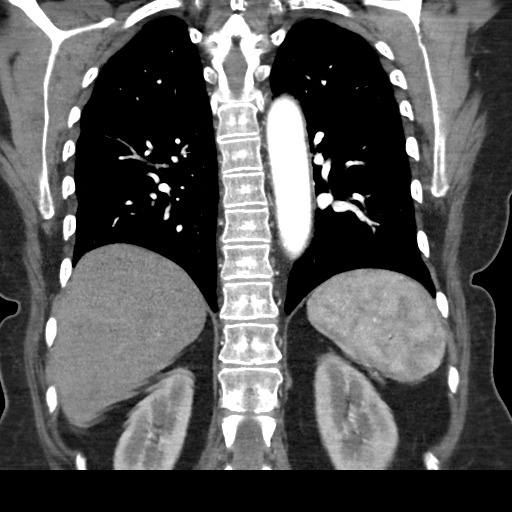

[18 of 46 positions shown; findings below may reference images not displayed]

FINDINGS: Cardiovascular: Preferential opacification of the thoracic aorta. No
evidence of thoracic aortic dissection. Normal heart size. No
pericardial effusion. The heart is normal in size. No pericardial
effusion.

Sinues of Valsalva: 28 mm 30 x 23 mm ,unchanged

Sinotubular Junction: 32 mm ,unchanged

Ascending Aorta: 40 mm increased from 38 mm by my measurements.

Aortic Arch: 35 mm ,unchanged

Descending aorta: 27 mm at the level of the carina ,unchanged

Branch vessels: Conventional branching pattern. No significant
atherosclerotic changes.

Coronary arteries: Normal origins and courses. No significant
atherosclerotic calcifications.

Main pulmonary artery: 26 mm ,unchanged. No evidence of central
pulmonary embolism.

Pulmonary veins: No anomalous pulmonary venous return. No evidence
of left atrial appendage thrombus.

Upper abdominal vasculature: Within normal limits.

Mediastinum/Nodes: No enlarged mediastinal, hilar, or axillary lymph
nodes. Thyroid gland, trachea, and esophagus demonstrate no
significant findings.

Lungs/Pleura: No focal consolidations. No suspicious pulmonary
nodules. No pleural effusion or pneumothorax.

Upper Abdomen: The visualized upper abdomen is within normal limits.

Musculoskeletal: No chest wall abnormality. No acute or significant
osseous findings.

Review of the MIP images confirms the above findings.
IMPRESSION: Vascular:

Minimal interval increased fusiform aneurysmal dilation of the
ascending thoracic aorta, now measuring up to 4.0 cm, previously
cm. Recommend annual imaging followup by CTA or MRA. This
recommendation follows 7323
ACCF/AHA/AATS/ACR/ASA/SCA/TIGER/RONLOR/EL REY/GINNA Guidelines for the
Diagnosis and Management of Patients with Thoracic Aortic Disease.
Circulation. 7323; 121: E266-e369. Aortic aneurysm NOS ([id number])

Non-Vascular:

No acute or significant thoracic abnormality.

## 2023-08-22 DIAGNOSIS — I491 Atrial premature depolarization: Secondary | ICD-10-CM | POA: Diagnosis not present

## 2023-08-29 DIAGNOSIS — M9903 Segmental and somatic dysfunction of lumbar region: Secondary | ICD-10-CM | POA: Diagnosis not present

## 2023-08-29 DIAGNOSIS — M62830 Muscle spasm of back: Secondary | ICD-10-CM | POA: Diagnosis not present

## 2023-08-29 DIAGNOSIS — M9902 Segmental and somatic dysfunction of thoracic region: Secondary | ICD-10-CM | POA: Diagnosis not present

## 2023-08-29 DIAGNOSIS — M47816 Spondylosis without myelopathy or radiculopathy, lumbar region: Secondary | ICD-10-CM | POA: Diagnosis not present

## 2023-09-23 ENCOUNTER — Encounter (HOSPITAL_BASED_OUTPATIENT_CLINIC_OR_DEPARTMENT_OTHER): Payer: Self-pay | Admitting: Family

## 2023-09-23 ENCOUNTER — Ambulatory Visit (HOSPITAL_BASED_OUTPATIENT_CLINIC_OR_DEPARTMENT_OTHER): Payer: Medicare Other | Admitting: Family

## 2023-09-23 VITALS — BP 126/78 | HR 71 | Ht 69.0 in | Wt 191.6 lb

## 2023-09-23 DIAGNOSIS — I471 Supraventricular tachycardia, unspecified: Secondary | ICD-10-CM

## 2023-09-23 DIAGNOSIS — R002 Palpitations: Secondary | ICD-10-CM | POA: Diagnosis not present

## 2023-09-23 DIAGNOSIS — I10 Essential (primary) hypertension: Secondary | ICD-10-CM | POA: Diagnosis not present

## 2023-09-23 DIAGNOSIS — I77810 Thoracic aortic ectasia: Secondary | ICD-10-CM | POA: Diagnosis not present

## 2023-09-23 NOTE — Progress Notes (Signed)
Cardiology Office Note:  .   Date:  09/23/2023  ID:  [name], DOB [birth year], MRN [id number] PCP: Joycelyn Rua, MD  [facility] Providers Cardiologist:  Bryan Lemma, MD    History of Present Illness: Marland Kitchen   [name] is a 72 y.o. female with hx of mild thoracic aortic dilation, hypertension, hyperlipidemia, aortic atherosclerosis.   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  Previously 3.8 cm. Repeat CT scheduled 11/2023. Vascular screen 02/2023 with normal bilateral carotids, normal ABI bilaterally, aorta with no aneurysm or dilation.   Last seen 08/06/23 noting more frequent palpitations described as forceful heartbeat. BMP, mag, TSH, CBC unremarkable. PRN Metoprolol tartrate was provided. ZIO with predominantly NSR averrage heart rate of 69 bpm, one run VT (6 beats at 179 bpm) and 171 runs of SVT up to 31.3 seconds, two of these episodes were triggered events.   Presents today for follow-up with her husband. Does water exercises at the [facility]. [place]  Review monitor report in detail.  Since last seen she had 1 or 2 brief episodes of palpitations but they have been overall quiescent.  Attributes prior palpitations to stress.  Does note she does increase caffeine and we discussed tactics to stay well-hydrated.  She has not taken as needed metoprolol.  No chest pain, dyspnea, edema, orthopnea, PND.  ROS: Please see the history of present illness.    All other systems reviewed and are negative.   Studies Reviewed: .        Cardiac Studies & Procedures       ECHOCARDIOGRAM  ECHOCARDIOGRAM COMPLETE 10/12/2020  Narrative ECHOCARDIOGRAM REPORT    Patient Name:   [name] [facility] Date of Exam: 10/12/2020 Medical Rec #:  [id number]       Height:       69.0 in Accession #:    [id number]      Weight:       197.0 lb Date of Birth:  08/04/1951       BSA:          2.053 m Patient  Age:    69 years        BP:           134/86 mmHg Patient Gender: F               HR:           68 bpm. Exam Location:  [address]  Procedure: 2D Echo, Cardiac Doppler and Color Doppler  Indications:    I35.9 Aortic Valve disease  History:        Patient has prior history of Echocardiogram examinations, most recent 10/23/2019. Thoracic ascending Aortic aneurysm, Signs/Symptoms:Murmur; Risk Factors:Dyslipidemia.  Sonographer:    Clearence Ped RCS Referring Phys: 65 DAVID W HARDING  IMPRESSIONS   1. Normal GLS -17.5. Left ventricular ejection fraction, by estimation, is 55 to 60%. The left ventricle has normal function. The left ventricle has no regional wall motion abnormalities. Left ventricular diastolic parameters were normal. 2. Right ventricular systolic function is normal. The right ventricular size is normal. There is normal pulmonary artery systolic pressure. 3. Left atrial size was mildly dilated. 4. The mitral valve is normal in structure. Mild mitral valve regurgitation. No evidence of mitral stenosis. 5. The aortic valve is normal in structure. Aortic valve regurgitation is not visualized. No aortic stenosis is present. 6. Aortic dilatation noted. There  is mild dilatation of the ascending aorta, measuring 39 mm. 7. The inferior vena cava is normal in size with greater than 50% respiratory variability, suggesting right atrial pressure of 3 mmHg.  FINDINGS Left Ventricle: Normal GLS -17.5. Left ventricular ejection fraction, by estimation, is 55 to 60%. The left ventricle has normal function. The left ventricle has no regional wall motion abnormalities. The left ventricular internal cavity size was normal in size. There is no left ventricular hypertrophy. Left ventricular diastolic parameters were normal.  Right Ventricle: The right ventricular size is normal. No increase in right ventricular wall thickness. Right ventricular systolic function is normal. There is normal  pulmonary artery systolic pressure. The tricuspid regurgitant velocity is 1.51 m/s, and with an assumed right atrial pressure of 3 mmHg, the estimated right ventricular systolic pressure is 12.1 mmHg.  Left Atrium: Left atrial size was mildly dilated.  Right Atrium: Right atrial size was normal in size.  Pericardium: There is no evidence of pericardial effusion.  Mitral Valve: The mitral valve is normal in structure. There is mild thickening of the mitral valve leaflet(s). There is mild calcification of the mitral valve leaflet(s). Mild mitral valve regurgitation. No evidence of mitral valve stenosis.  Tricuspid Valve: The tricuspid valve is normal in structure. Tricuspid valve regurgitation is trivial. No evidence of tricuspid stenosis.  Aortic Valve: The aortic valve is normal in structure. Aortic valve regurgitation is not visualized. No aortic stenosis is present.  Pulmonic Valve: The pulmonic valve was normal in structure. Pulmonic valve regurgitation is not visualized. No evidence of pulmonic stenosis.  Aorta: The aortic root is normal in size and structure and aortic dilatation noted. There is mild dilatation of the ascending aorta, measuring 39 mm.  Venous: The inferior vena cava is normal in size with greater than 50% respiratory variability, suggesting right atrial pressure of 3 mmHg.  IAS/Shunts: The interatrial septum appears to be lipomatous. No atrial level shunt detected by color flow Doppler.   LEFT VENTRICLE PLAX 2D LVIDd:         5.20 cm  Diastology LVIDs:         3.40 cm  LV e' medial:    7.83 cm/s LV PW:         1.00 cm  LV E/e' medial:  6.5 LV IVS:        1.00 cm  LV e' lateral:   9.95 cm/s LVOT diam:     1.90 cm  LV E/e' lateral: 5.1 LV SV:         59 LV SV Index:   29 LVOT Area:     2.84 cm   RIGHT VENTRICLE RV Basal diam:  3.30 cm RV S prime:     13.20 cm/s TAPSE (M-mode): 2.5 cm RVSP:           12.1 mmHg  LEFT ATRIUM             Index       RIGHT  ATRIUM           Index LA diam:        3.80 cm 1.85 cm/m  RA Pressure: 3.00 mmHg LA Vol (A2C):   60.9 ml 29.67 ml/m RA Area:     12.40 cm LA Vol (A4C):   27.7 ml 13.49 ml/m RA Volume:   26.10 ml  12.71 ml/m LA Biplane Vol: 44.2 ml 21.53 ml/m AORTIC VALVE LVOT Vmax:   84.90 cm/s LVOT Vmean:  60.600 cm/s LVOT VTI:  0.208 m  AORTA Ao Root diam: 3.50 cm Ao Asc diam:  3.90 cm  MITRAL VALVE               TRICUSPID VALVE MV Area (PHT):             TR Peak grad:   9.1 mmHg MV Decel Time:             TR Vmax:        151.00 cm/s MV E velocity: 51.00 cm/s  Estimated RAP:  3.00 mmHg MV A velocity: 63.00 cm/s  RVSP:           12.1 mmHg MV E/A ratio:  0.81 SHUNTS Systemic VTI:  0.21 m Systemic Diam: 1.90 cm  Charlton Haws MD Electronically signed by Charlton Haws MD Signature Date/Time: 10/12/2020/12:24:57 PM    Final             Risk Assessment/Calculations:             Physical Exam:   VS:  BP 126/78 (BP Location: Left Arm, Patient Position: Sitting, Cuff Size: Normal)   Pulse 71   Ht 5\' 9"  (1.753 m)   Wt 191 lb 9.6 oz (86.9 kg)   LMP  (LMP Unknown)   SpO2 97%   BMI 28.29 kg/m    Wt Readings from Last 3 Encounters:  09/23/23 191 lb 9.6 oz (86.9 kg)  08/06/23 192 lb (87.1 kg)  01/08/23 193 lb 9.6 oz (87.8 kg)    GEN: Well nourished, well developed in no acute distress NECK: No JVD; No carotid bruits CARDIAC: RRR, no murmurs, rubs, gallops RESPIRATORY:  Clear to auscultation without rales, wheezing or rhonchi  ABDOMEN: Soft, non-tender, non-distended EXTREMITIES:  No edema; No deformity   ASSESSMENT AND PLAN: .    Papitations - ZIO 08/2023 with predominantly NSR averrage heart rate of 69 bpm, one run VT (6 beats at 179 bpm) and 171 runs of SVT up to 31.3 seconds, two of these episodes were triggered events. BMP, mag, TSH, CBC were unremarkable. Palpitations recently have been quiescent. Will continue Metoprolol Tartrate 25mg  PRN for palpitations. We discussed if  palpitations become more bothersome could transition to Metoprolol Succinate. She would prefer avoidance of daily medications unless absolutely necessary at this time. Encouraged to limit caffeine, stay hydrated, manage stress well.    HTN - BP well controlled. Continue current antihypertensive regimen hydrochlorothiazide 25mg  daily.    Ectasia of thoracic aorta - CT for monitoring upcoming 11/08/2023. Continue BP control, as above.              Dispo: follow up in 12/2023 with Dr. Herbie Baltimore   Signed, Alver Sorrow, NP

## 2023-09-23 NOTE — Patient Instructions (Signed)
Medication Instructions:  Your physician has recommended you make the following change in your medication:  Start Metoprolol as needed  *If you need a refill on your cardiac medications before your next appointment, please call your pharmacy*   Lab Work:  Please return for Lab work. You may come to the...   Therapist, music (3rd floor) [address] [place], [place] [address]  Open: 8am-Noon and 1pm-4:30pm  Please ring the doorbell on the small table when you exit the elevator and the Lab Tech will come get you  If you have labs (blood work) drawn today and your tests are completely normal, you will receive your results only by: MyChart Message (if you have MyChart) OR A paper copy in the mail If you have any lab test that is abnormal or we need to change your treatment, we will call you to review the results.  Follow-Up: At [facility], you and your health needs are our priority.  As part of our continuing mission to provide you with exceptional heart care, we have created designated Provider Care Teams.  These Care Teams include your primary Cardiologist (physician) and Advanced Practice Providers (APPs -  Physician Assistants and Nurse Practitioners) who all work together to provide you with the care you need, when you need it.  We recommend signing up for the patient portal called "MyChart".  Sign up information is provided on this After Visit Summary.  MyChart is used to connect with patients for Virtual Visits (Telemedicine).  Patients are able to view lab/test results, encounter notes, upcoming appointments, etc.  Non-urgent messages can be sent to your provider as well.   To learn more about what you can do with MyChart, go to [email or web address].    Your next appointment:   Follow up with Dr. Herbie Baltimore as scheduled.

## 2023-09-27 DIAGNOSIS — M47816 Spondylosis without myelopathy or radiculopathy, lumbar region: Secondary | ICD-10-CM | POA: Diagnosis not present

## 2023-09-27 DIAGNOSIS — M62830 Muscle spasm of back: Secondary | ICD-10-CM | POA: Diagnosis not present

## 2023-09-27 DIAGNOSIS — M9902 Segmental and somatic dysfunction of thoracic region: Secondary | ICD-10-CM | POA: Diagnosis not present

## 2023-09-27 DIAGNOSIS — M9903 Segmental and somatic dysfunction of lumbar region: Secondary | ICD-10-CM | POA: Diagnosis not present

## 2023-11-04 ENCOUNTER — Other Ambulatory Visit: Payer: Self-pay | Admitting: Cardiology

## 2023-11-04 DIAGNOSIS — I359 Nonrheumatic aortic valve disorder, unspecified: Secondary | ICD-10-CM | POA: Diagnosis not present

## 2023-11-04 DIAGNOSIS — Z0000 Encounter for general adult medical examination without abnormal findings: Secondary | ICD-10-CM | POA: Diagnosis not present

## 2023-11-04 DIAGNOSIS — I77810 Thoracic aortic ectasia: Secondary | ICD-10-CM | POA: Diagnosis not present

## 2023-11-05 LAB — BASIC METABOLIC PANEL
BUN/Creatinine Ratio: 20 (ref 12–28)
BUN: 15 mg/dL (ref 8–27)
CO2: 24 mmol/L (ref 20–29)
Calcium: 10.2 mg/dL (ref 8.7–10.3)
Chloride: 101 mmol/L (ref 96–106)
Creatinine, Ser: 0.74 mg/dL (ref 0.57–1.00)
Glucose: 98 mg/dL (ref 70–99)
Potassium: 4.2 mmol/L (ref 3.5–5.2)
Sodium: 140 mmol/L (ref 134–144)
eGFR: 85 mL/min/{1.73_m2} (ref >59)

## 2023-11-07 ENCOUNTER — Telehealth: Payer: Self-pay | Admitting: *Deleted

## 2023-11-07 NOTE — Telephone Encounter (Signed)
-----   Message from Bryan Lemma sent at 11/05/2023  6:08 PM EST ----- Normal chemistry panel.  Normal kidney function, sodium and potassium levels.  Bryan Lemma, MD

## 2023-11-07 NOTE — Telephone Encounter (Signed)
Left  detail message of result on secure voicemail per DPR.  Any question may call back . Keep appt for Jan 2025

## 2023-11-08 ENCOUNTER — Ambulatory Visit (HOSPITAL_BASED_OUTPATIENT_CLINIC_OR_DEPARTMENT_OTHER)
Admission: RE | Admit: 2023-11-08 | Discharge: 2023-11-08 | Disposition: A | Discharge status: Home / Self Care | Payer: Medicare Other | Source: Ambulatory Visit | Attending: Cardiology | Admitting: Cardiology

## 2023-11-08 DIAGNOSIS — I700 Atherosclerosis of aorta: Secondary | ICD-10-CM | POA: Diagnosis not present

## 2023-11-08 DIAGNOSIS — I7121 Aneurysm of the ascending aorta, without rupture: Secondary | ICD-10-CM | POA: Diagnosis not present

## 2023-11-08 DIAGNOSIS — R209 Unspecified disturbances of skin sensation: Secondary | ICD-10-CM | POA: Insufficient documentation

## 2023-11-08 DIAGNOSIS — I77810 Thoracic aortic ectasia: Secondary | ICD-10-CM | POA: Insufficient documentation

## 2023-11-08 MED ORDER — IOHEXOL 350 MG/ML SOLN
75.0000 mL | Freq: Once | INTRAVENOUS | Status: AC | PRN
  Administered 2023-11-08: 75 mL via INTRAVENOUS

## 2023-12-02 DIAGNOSIS — I471 Supraventricular tachycardia, unspecified: Secondary | ICD-10-CM | POA: Diagnosis not present

## 2023-12-02 DIAGNOSIS — J4530 Mild persistent asthma, uncomplicated: Secondary | ICD-10-CM | POA: Diagnosis not present

## 2023-12-02 DIAGNOSIS — I700 Atherosclerosis of aorta: Secondary | ICD-10-CM | POA: Diagnosis not present

## 2023-12-02 DIAGNOSIS — Z0000 Encounter for general adult medical examination without abnormal findings: Secondary | ICD-10-CM | POA: Diagnosis not present

## 2023-12-02 DIAGNOSIS — I10 Essential (primary) hypertension: Secondary | ICD-10-CM | POA: Diagnosis not present

## 2023-12-02 DIAGNOSIS — E7800 Pure hypercholesterolemia, unspecified: Secondary | ICD-10-CM | POA: Diagnosis not present

## 2023-12-02 DIAGNOSIS — K219 Gastro-esophageal reflux disease without esophagitis: Secondary | ICD-10-CM | POA: Diagnosis not present

## 2023-12-02 DIAGNOSIS — Z79899 Other long term (current) drug therapy: Secondary | ICD-10-CM | POA: Diagnosis not present

## 2023-12-02 DIAGNOSIS — I712 Thoracic aortic aneurysm, without rupture, unspecified: Secondary | ICD-10-CM | POA: Diagnosis not present

## 2023-12-27 ENCOUNTER — Ambulatory Visit: Payer: Medicare Other | Attending: Cardiology | Admitting: Cardiology

## 2023-12-27 ENCOUNTER — Encounter: Payer: Self-pay | Admitting: Cardiology

## 2023-12-27 VITALS — BP 126/78 | Ht 69.0 in | Wt 194.0 lb

## 2023-12-27 DIAGNOSIS — I77810 Thoracic aortic ectasia: Secondary | ICD-10-CM | POA: Diagnosis not present

## 2023-12-27 DIAGNOSIS — E785 Hyperlipidemia, unspecified: Secondary | ICD-10-CM

## 2023-12-27 DIAGNOSIS — R002 Palpitations: Secondary | ICD-10-CM | POA: Diagnosis not present

## 2023-12-27 DIAGNOSIS — I10 Essential (primary) hypertension: Secondary | ICD-10-CM | POA: Diagnosis not present

## 2023-12-27 DIAGNOSIS — I359 Nonrheumatic aortic valve disorder, unspecified: Secondary | ICD-10-CM | POA: Diagnosis not present

## 2023-12-27 DIAGNOSIS — G609 Hereditary and idiopathic neuropathy, unspecified: Secondary | ICD-10-CM

## 2023-12-27 NOTE — Progress Notes (Unsigned)
Cardiology Office Note:  .   Date:  12/30/2023  ID:  [name], DOB 02/15/1951, MRN [id number] PCP: Joycelyn Rua, MD  [facility] Providers Cardiologist:  Bryan Lemma, MD     Chief Complaint  Patient presents with   Follow-up    Post CTA.    Patient Profile: [name] Kitchen     [name] is a  73 y.o. female  with a PMH notable for mild thoracic aortic dilation, hypertension, hyperlipidemia, aortic atherosclerosis who presents here for ~ 4 month at the request of Joycelyn Rua, MD.    [name] was last seen on 09/23/2023 by Gillian Shields, NP to discuss results of her monitor that was ordered for palpitations during her visit on September 3.  At that time she had provided PRN Lopressor.  She also ordered a follow-up CTA-Chest/Aorta to reassess thoracic aortic dilation. ZIO with predominantly NSR averrage heart rate of 69 bpm, one run VT (6 beats at 179 bpm) and 171 runs of SVT up to 31.3 seconds, two of these episodes were triggered events.  During the October visit, she presented with her husband femoral support.  Indicated that she was still doing water exercise at the [facility], and had recently enjoyed a trip to the mountains to the [facility].   Since last seen she had 1 or 2 brief episodes of palpitations but they have been overall quiescent.  Attributes prior palpitations to stress.  Does note she does increase caffeine and we discussed tactics to stay well-hydrated.  She has not taken as needed metoprolol.  No chest pain, dyspnea, edema, orthopnea, PND.  Subjective  Discussed the use of AI scribe software for clinical note transcription with the patient, who gave verbal consent to proceed.  History of Present Illness   The patient, with a history of atherosclerosis and dilated ascending thoracic aorta, presented for a follow-up consultation. The primary concern was episodes of tachycardia, described as palpitations, which had been previously investigated with a  two-week heart monitor. The monitor results indicated occasional runs of fast beats from the ventricle and 171 instances of atrial tachycardia. The patient reported that these episodes have not recurred with the same intensity since the monitoring period - has not required PRN Lopressor (metoprolol tartrate).   Other CV ROS negative:  positive for - Rare palpitations that she now is able to disregard.  Worse if she drinks caffeine or has stress. negative for - chest pain, dyspnea on exertion, edema, orthopnea, paroxysmal nocturnal dyspnea, rapid heart rate, shortness of breath, or syncope or near syncope, TIA or amaurosis fugax, claudication    The patient also reported a sensation of coldness in the feet, progressing up the legs, which was described as a combination of actual coldness to touch and a subjective feeling of coldness. This symptom was associated with a diagnosis of neuropathy. The patient denied experiencing pain in the thighs or calves, but noted a heightened sensitivity to touch during pedicures, suggesting a nerve-related issue rather than a vascular one.  Additionally, the patient has a dilated ascending thoracic aorta, which has remained stable at 4 cm over the past several years. The patient expressed relief that this condition has not progressed and is not classified as an aneurysm.  Lastly, the patient mentioned a hard, non-painful lump on the back of the head. The patient expressed concern about this lump but seemed reassured that it was likely benign and not indicative of a serious condition.  Objective   Studies Reviewed: .        14-day Zio patch monitor (9/3-9/17/2024)   Predominant rhythm is sinus with a rate range of 47 to 121 bpm and an average of 69 bpm.   Rare (<1%) PACs and PVCs (premature atrial and premature ventricular contractions) which were both noted on diary.   1 ventricular run of 5 beat with an average rate of 155 bpm lasting 3 seconds..  Not  noted on trigger   171 atrial runs noted.  Only 2 noted.  Longest episode was 72 beats (31 seconds with an average rate of 138 bpm.) fastest was 8 beats (3.2 seconds) with a average rate of 140 bpm.   No Sustained Arrhythmias: Atrial Tachycardia (AT), Supraventricular Tachycardia (SVT), Atrial Fibrillation (A-Fib), Atrial Flutter (A-Flutter), Sustained Ventricular Tachycardia (VT)   No significant bradycardia, sinus pauses or AV block noted.  CTA Chest-Aorta December 2024: Stable 4 cm ascending thoracic aortic fusiform dilation.  ECHOCARDIOGRAM  10/2019: EF 60 to 65%. ? GRII DD w/ Nl LA size (does not correlate!), Nl RA size.  AoV ~ cannot exclude bicuspid, No AS or sclerosis.  Mild dilation of ascending aorta-4 mm 10/2020: EF 55 to 60%.  Normal aortic valve with no stenosis.  3 leaflets noted. No results found for: "CHOL", "HDL", "LDLCALC", "LDLDIRECT", "TRIG", "CHOLHDL"; Labs from 12/02/2023: TC 169, TG 108, HDL 69, LDL 80; Cr 0.74, K+ 4.2.  Risk Assessment/Calculations:          Physical Exam:   VS:  BP 126/78 (BP Location: Left Arm, Patient Position: Sitting, Cuff Size: Normal)   Ht 5\' 9"  (1.753 m)   Wt 194 lb (88 kg)   LMP  (LMP Unknown)   BMI 28.65 kg/m    Wt Readings from Last 3 Encounters:  12/27/23 194 lb (88 kg)  09/23/23 191 lb 9.6 oz (86.9 kg)  08/06/23 192 lb (87.1 kg)    GEN: Well nourished, well developed in no acute distress; healthy appearing NECK: No JVD; No carotid bruits CARDIAC: RRR, Normal S1, S2; soft 1/6 SEM at RUSB but otherwise no additional murmurs, rubs, gallops RESPIRATORY:  Clear to auscultation without rales, wheezing or rhonchi ; nonlabored, good air movement. ABDOMEN: Soft, non-tender, non-distended EXTREMITIES:  No edema; No deformity     ASSESSMENT AND PLAN: .    Problem List Items Addressed This Visit       Cardiology Problems   Aortic valve disease (Chronic)   Echo in 2021 showed normal aortic valve.      Dyslipidemia, goal LDL below  100 (Chronic)   As of December 2024-TC 169 and LDL 80.   Well-controlled on the current dose of rosuvastatin 10 mg daily Tolerating without any myalgias. -Continue rosuvastatin      Essential hypertension (Chronic)   Well-controlled BP.  Is on HCTZ 25 mg daily. -Continue current meds      Thoracic aortic ectasia (HCC) - Primary (Chronic)   Follow-up CTA Chest/Aorta shows stable 4 cm fusiform dilation.  I spent some time explaining the difference between aneurysm and fusiform dilation/ectasia.  Reassured her that this is not a true aneurysm.  We will simply follow-up intermittently. No significant change over the past seven years. -Plan to recheck with CT scan in three years. -Continue to treat BP and other CRF-hyperlipidemia        Other   Palpitations (Chronic)   Multiple episodes of SVT/bursts of PAT noted on heart monitor, some of which were symptomatic.  Discussed the  nature of SVT/bursts of PAT and potential triggers. No significant ventricular ectopy noted. -Continue metoprolol as needed for symptomatic episodes. -Consider vagal maneuvers during episodes. -Consider obtaining a Cardio Mobile device for further monitoring if symptoms recur. -Avoid triggers such as stress, inadequate sleep, dehydration, caffeine and sweets.      Peripheral neuropathy (Chronic)   Complaints of cold feet and numbness, possibly related to peripheral neuropathy. Good peripheral pulses noted. -Continue to keep feet warm and maintain regular walking exercise. -Consider nerve stimulation techniques to improve symptoms.       Follow-Up: Return in about 1 year (around 12/26/2024) for Routine follow up with me.  I spent a total of 56 minutes in the care of [name] today including reviewing outside labs from [facility] (1 min), reviewing studies (7 min), face to face time discussing treatment options (32 min - we reviewed both studies and discuss pathophysiology of PAT, and thoracic aortic  dilation.  We also then talked about her neuropathy symptoms and how to treat.), reviewing records from recent APP notes (6 minutes), 10 minutes dictating, and documenting in the encounter.    Signed, Marykay Lex, MD, MS Bryan Lemma, M.D., M.S. Interventional Cardiologist  [facility]  Pager # [phone] Phone # [phone] [address] [place], [place] [address]

## 2023-12-27 NOTE — Patient Instructions (Signed)
Medication Instructions:  No changes *If you need a refill on your cardiac medications before your next appointment, please call your pharmacy*   Lab Work: Not needed   Testing/Procedures: Not needed   Follow-Up: At [facility], you and your health needs are our priority.  As part of our continuing mission to provide you with exceptional heart care, we have created designated Provider Care Teams.  These Care Teams include your primary Cardiologist (physician) and Advanced Practice Providers (APPs -  Physician Assistants and Nurse Practitioners) who all work together to provide you with the care you need, when you need it.  We recommend signing up for the patient portal called "MyChart".  Sign up information is provided on this After Visit Summary.  MyChart is used to connect with patients for Virtual Visits (Telemedicine).  Patients are able to view lab/test results, encounter notes, upcoming appointments, etc.  Non-urgent messages can be sent to your provider as well.   To learn more about what you can do with MyChart, go to [email or web address].    Your next appointment:   12 month(s)  The format for your next appointment:   In Person  Provider:   Bryan Lemma, MD

## 2023-12-30 ENCOUNTER — Encounter: Payer: Self-pay | Admitting: Cardiology

## 2023-12-30 DIAGNOSIS — R002 Palpitations: Secondary | ICD-10-CM | POA: Insufficient documentation

## 2023-12-30 DIAGNOSIS — G629 Polyneuropathy, unspecified: Secondary | ICD-10-CM | POA: Insufficient documentation

## 2023-12-30 NOTE — Assessment & Plan Note (Addendum)
Follow-up CTA Chest/Aorta shows stable 4 cm fusiform dilation.  I spent some time explaining the difference between aneurysm and fusiform dilation/ectasia.  Reassured her that this is not a true aneurysm.  We will simply follow-up intermittently. No significant change over the past seven years. -Plan to recheck with CT scan in three years. -Continue to treat BP and other CRF-hyperlipidemia

## 2023-12-30 NOTE — Assessment & Plan Note (Signed)
Well-controlled BP.  Is on HCTZ 25 mg daily. -Continue current meds

## 2023-12-30 NOTE — Assessment & Plan Note (Signed)
As of December 2024-TC 169 and LDL 80.   Well-controlled on the current dose of rosuvastatin 10 mg daily Tolerating without any myalgias. -Continue rosuvastatin

## 2023-12-30 NOTE — Assessment & Plan Note (Signed)
Multiple episodes of SVT/bursts of PAT noted on heart monitor, some of which were symptomatic.  Discussed the nature of SVT/bursts of PAT and potential triggers. No significant ventricular ectopy noted. -Continue metoprolol as needed for symptomatic episodes. -Consider vagal maneuvers during episodes. -Consider obtaining a Cardio Mobile device for further monitoring if symptoms recur. -Avoid triggers such as stress, inadequate sleep, dehydration, caffeine and sweets.

## 2023-12-30 NOTE — Assessment & Plan Note (Signed)
Echo in 2021 showed normal aortic valve.

## 2023-12-30 NOTE — Assessment & Plan Note (Signed)
Complaints of cold feet and numbness, possibly related to peripheral neuropathy. Good peripheral pulses noted. -Continue to keep feet warm and maintain regular walking exercise. -Consider nerve stimulation techniques to improve symptoms.

## 2024-02-13 DIAGNOSIS — Z1231 Encounter for screening mammogram for malignant neoplasm of breast: Secondary | ICD-10-CM | POA: Diagnosis not present

## 2024-02-14 DIAGNOSIS — Z1211 Encounter for screening for malignant neoplasm of colon: Secondary | ICD-10-CM | POA: Diagnosis not present

## 2024-02-14 DIAGNOSIS — K5904 Chronic idiopathic constipation: Secondary | ICD-10-CM | POA: Diagnosis not present

## 2024-03-09 DIAGNOSIS — L814 Other melanin hyperpigmentation: Secondary | ICD-10-CM | POA: Diagnosis not present

## 2024-03-09 DIAGNOSIS — D225 Melanocytic nevi of trunk: Secondary | ICD-10-CM | POA: Diagnosis not present

## 2024-03-09 DIAGNOSIS — L821 Other seborrheic keratosis: Secondary | ICD-10-CM | POA: Diagnosis not present

## 2024-03-11 ENCOUNTER — Encounter: Payer: Self-pay | Admitting: Internal Medicine

## 2024-03-11 ENCOUNTER — Ambulatory Visit: Admitting: Internal Medicine

## 2024-03-11 ENCOUNTER — Ambulatory Visit: Payer: Medicare Other | Admitting: Family

## 2024-03-11 DIAGNOSIS — J4520 Mild intermittent asthma, uncomplicated: Secondary | ICD-10-CM

## 2024-03-11 MED ORDER — MONTELUKAST SODIUM 10 MG PO TABS: 10.0000 mg | ORAL_TABLET | Freq: Every day | ORAL | 11 refills | Status: AC

## 2024-03-11 MED ORDER — FLUTICASONE PROPIONATE HFA 44 MCG/ACT IN AERO: INHALATION_SPRAY | RESPIRATORY_TRACT | 11 refills | Status: DC

## 2024-03-11 NOTE — Patient Instructions (Signed)
 It was a pleasure to see you today!  Please schedule follow up scheduled with myself in 3 months.  If my schedule is not open yet, we will contact you with a reminder closer to that time. Please call [phone] if you haven't heard from [place] a month before, and always call us sooner if issues or concerns arise. You can also send [place] a message through MyChart, but but aware that this is not to be used for urgent issues and it may take up to 5-7 days to receive a reply. Please be aware that you will likely be able to view your results before I have a chance to respond to them. Please give [place] 5 business days to respond to any non-urgent results.    I suspect the cough is multifactorial from asthma, reflux, and chronic sinusitis from allergies.  Resume flovent 2 puffs twice daily, GARGLE after use. Use with spacer to reduce voice hoarseness Follow acid reflux diet. Continue medication with pepcid and omeprazole. Try sleeping with wedge pillow at night. For allergies continue loratidine and flonase nasal spray. Add montelukast 1 pill once daily.

## 2024-03-11 NOTE — Progress Notes (Signed)
 [name]    [id number]    [birth year]  Primary Care Physician:Meyers, Jeannett Senior, MD Date of Appointment: 03/11/2024 Established Patient Visit  Chief complaint:   Chief Complaint  Patient presents with   Follow-up    Persistent coughing. She states she coughs up thick white mucus; turns green in color in the morning. Been going on for 5 years.      HPI: [name] is a 73 y.o. woman with mild persistent asthma, seasonal allergic rhinitis.  Interval Updates: Here for follow up after 2 years. I have not seen her in over 3 years.   Having worsening cough with early morning drainage.   White thick and green sometimes. Feels like it accumulates overnight.  Also wakes her up at night. Also worse with food. Always keeps water next to her bedside table. Sometimes has a bad coughing paroxysm. She does have reflux. Takes omeprazole 20 mg daily with pepcid on top of this. Reflux triggers - dairy, tomato. eating late at bedtime.  Denies dyspnea.Occasional wheezing.   Taking OTC - anti-histamine. Loratadine.  Taking flonase nasal spray most days.   Avoids flovent inhaler due to voice hoarseness. Has not been gargling. Does think it helps her coughing.   Asthma Control Test ACT Total Score  03/11/2024 12:55 PM 13  11/09/2022 10:33 AM 17  03/07/2021  9:06 AM 18      I have reviewed the patient's family social and past medical history and updated as appropriate.   Past Medical History:  Diagnosis Date   Arthritis    Asthma    Complication of anesthesia    patient report she had a saddle block during c section and was aware during surgery that was being " cut on"    Dilation of thoracic aorta (HCC) 09/2015   Stable:CTA Chest 10/20/2019: Ascending thoracic aorta greatest diameter 39 mm (compared to 2016 - 37 mm no significant change).Marland Kitchen   GERD (gastroesophageal reflux disease)    Hypertension    Pneumonia     Past Surgical History:  Procedure Laterality Date    APPENDECTOMY  2012   bladder disection     CESAREAN SECTION     x 2   Chest CTA  10/2019   CTA Chest 10/20/2019: Ascending thoracic aorta greatest diameter 39 mm (compared to 2016 - 37 mm no significant change)..   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 ? GRII DD w/ Nl LA size (does not correlate!), Nl RA size.  AoV ~ cannot exclude bicuspid, No AS or sclerosis.  Mild dilation of ascending aorta-4 mm.;; B) 12/2019: EF 55 to 60%.  No R WMA.  Normal D Fxn.  Mild LA dilation.  Normal aortic valve.  Stable aortic dilation-39 mm   XI ROBOTIC ASSISTED VENTRAL HERNIA N/A 12/13/2020   Procedure: ROBOTIC INCISIONAL HERNIA REPAIR WITH MESH;  Surgeon: Axel Filler, MD;  Location: [facility] OR;  Service: General;  Laterality: N/A;    Family History  Problem Relation Age of Onset   Asthma Brother  Asthma Paternal Grandfather    Clotting disorder Mother    Asthma Brother     Social History   Occupational History   Occupation: Homemaker  Tobacco Use   Smoking status: Former    Current packs/day: 0.00    Average packs/day: 1 pack/day for 5.0 years (5.0 ttl pk-yrs)    Types: Cigarettes    Start date: 12/03/1968    Quit date: 12/03/1973    Years since quitting: 50.3   Smokeless tobacco: Never  Vaping Use   Vaping status: Never Used  Substance and Sexual Activity   Alcohol use: Yes    Comment: wine beer and liquor on occasion    Drug use: No   Sexual activity: Not on file     Physical Exam: Blood pressure (!) 142/80, pulse 70, height 5\' 9"  (1.753 m), weight 193 lb (87.5 kg), SpO2 96%.  Gen:      No acute distress, hoarse voice,  frequent coughing, barking quality ENT:  +cobble stoning, mild nasal debris, no nasal polyps, mucus membranes moist Lungs:    No increased respiratory effort, symmetric chest wall excursion, clear to auscultation bilaterally, no wheezes or crackles CV:         Regular rate and rhythm; no murmurs, rubs, or gallops.  No pedal edema   Data Reviewed: Imaging: I have personally reviewed the ct angio chest Dec 2024 - stable 4 cm Aortic aneurysm. No acute pulmonary process.  PFTs:     Latest Ref Rng & Units 07/12/2015    3:01 PM  PFT Results  FVC-Pre L 3.49   FVC-Predicted Pre % 93   FVC-Post L 3.54   FVC-Predicted Post % 94   Pre FEV1/FVC % % 64   Post FEV1/FCV % % 67   FEV1-Pre L 2.24   FEV1-Predicted Pre % 78   FEV1-Post L 2.36   DLCO uncorrected ml/min/mmHg 24.18   DLCO UNC% % 79   DLVA Predicted % 85   TLC L 5.41   TLC % Predicted % 94   RV % Predicted % 73    I have personally reviewed the patient's PFTs and mild airflow limitation  Labs: Lab Results  Component Value Date   NA 140 11/04/2023   K 4.2 11/04/2023   CO2 24 11/04/2023   GLUCOSE 98 11/04/2023   BUN 15 11/04/2023   CREATININE 0.74 11/04/2023   CALCIUM 10.2 11/04/2023   GFR 74.07 10/01/2016   EGFR 85 11/04/2023   GFRNONAA >60 12/07/2020   Lab Results  Component Value Date   WBC 11.4 (H) 08/06/2023   HGB 14.6 08/06/2023   HCT 44.2 08/06/2023   MCV 85 08/06/2023   PLT 317 08/06/2023    Immunization status: Immunization History  Administered Date(s) Administered   Fluad Quad(high Dose 65+) 09/10/2022   Influenza Split 09/02/2014, 08/04/2015   Influenza, High Dose Seasonal PF 09/02/2017, 09/18/2019   Influenza,inj,quad, With Preservative 09/25/2017   Influenza-Unspecified 09/02/2017, 09/18/2019   PFIZER(Purple Top)SARS-COV-2 Vaccination 01/07/2020, 02/01/2020   Pneumococcal Conjugate-13 01/09/2016   Zoster Recombinant(Shingrix) 04/16/2018    External Records Personally Reviewed:  pulmonary  Assessment:  Chronic Cough likely multifactorial  Poorly controlled mild persistent asthma GERD not well controlled Allergic rhinitis not well controlled  Plan/Recommendations:  Resume flovent 2 puffs twice daily, GARGLE after use. Use with spacer to reduce voice hoarseness Follow acid reflux diet. Continue medication with pepcid and omeprazole. Try sleeping with wedge pillow at night. For allergies continue loratidine and flonase nasal spray. Add montelukast 1  pill once daily.     Return to Care: Return in about 3 months (around 06/10/2024).   Durel Salts, MD Pulmonary and Critical Care Medicine [facility] [phone]

## 2024-03-12 ENCOUNTER — Other Ambulatory Visit (HOSPITAL_COMMUNITY): Payer: Self-pay

## 2024-03-12 ENCOUNTER — Telehealth: Payer: Self-pay

## 2024-03-12 NOTE — Telephone Encounter (Signed)
 Pharmacy Patient Advocate Encounter   Received notification from Pt Calls Messages that prior authorization for Albuterol HFA is required/requested.   Insurance verification completed.   The patient is insured through [employer] .   Per test claim: The current 30 day co-pay is, $5.00.  No PA needed at this time. This test claim was processed through [facility]- copay amounts may vary at other pharmacies due to pharmacy/plan contracts, or as the patient moves through the different stages of their insurance plan.

## 2024-03-12 NOTE — Telephone Encounter (Signed)
 Copied from CRM [id number]. Topic: Clinical - Medication Question >> Mar 12, 2024 11:34 AM Renie Ora wrote: Reason for CRM: Patient stated she received a letter from her insurance company that her albuterol (VENTOLIN HFA) 108 (90 Base) MCG/ACT inhaler is not covered by her insurance and she is not out of the inhaler however she will be soon and she would like a replacement that is covered by her insurance.  Routing to pharmacy team to see what is covered.

## 2024-03-12 NOTE — Telephone Encounter (Signed)
 Per test claims both the generic 8.5gm and 18gm inhalers are both covered at this time.

## 2024-03-13 MED ORDER — ALBUTEROL SULFATE HFA 108 (90 BASE) MCG/ACT IN AERS: 2.0000 | INHALATION_SPRAY | Freq: Four times a day (QID) | RESPIRATORY_TRACT | 2 refills | Status: AC | PRN

## 2024-03-13 NOTE — Telephone Encounter (Signed)
 Please advise Beth for inhaler

## 2024-03-13 NOTE — Telephone Encounter (Signed)
 Sent!

## 2024-03-13 NOTE — Addendum Note (Signed)
 Addended by: Glenford Bayley on: 03/13/2024 12:36 PM   Modules accepted: Orders

## 2024-03-14 ENCOUNTER — Encounter: Payer: Self-pay | Admitting: Internal Medicine

## 2024-03-17 ENCOUNTER — Telehealth: Payer: Self-pay

## 2024-03-17 ENCOUNTER — Other Ambulatory Visit (HOSPITAL_COMMUNITY): Payer: Self-pay

## 2024-03-17 MED ORDER — ARNUITY ELLIPTA 50 MCG/ACT IN AEPB: 1.0000 | INHALATION_SPRAY | Freq: Every day | RESPIRATORY_TRACT | 5 refills | Status: DC

## 2024-03-17 NOTE — Telephone Encounter (Signed)
 Pharmacy team please perform ICS or ICS-LABA BIV.

## 2024-03-17 NOTE — Telephone Encounter (Signed)
*  sent to office in original request   Flovent is no longer available as Brand, generic is not covered by patients plan. Preferred ICS are:   Arnuity- $225.17 Qvar Redihaler- $220.58  Preferred ICS-LABA:  Breo Ellipta- $255.17 Dulera- $308.17 Generic Advair Diskus/Wixela- $91.37 Brand Symbicort- $235.92  *patient has a deductible to meet causing these prices to be higher at this time.

## 2024-03-17 NOTE — Telephone Encounter (Signed)
 Flovent is no longer available as Brand, generic is not covered by patients plan. Preferred ICS are:   Arnuity- $225.17 Qvar Redihaler- $220.58  Preferred ICS-LABA:  Breo Ellipta- $255.17 Dulera- $308.17 Generic Advair Diskus/Wixela- $91.37 Brand Symbicort- $235.92  *patient has a deductible to meet causing these prices to be higher at this time.

## 2024-03-18 ENCOUNTER — Other Ambulatory Visit: Payer: Self-pay

## 2024-03-18 ENCOUNTER — Encounter: Payer: Self-pay | Admitting: Internal Medicine

## 2024-03-18 MED ORDER — ARNUITY ELLIPTA 50 MCG/ACT IN AEPB: 1.0000 | INHALATION_SPRAY | Freq: Every day | RESPIRATORY_TRACT | 5 refills | Status: AC

## 2024-03-18 MED ORDER — ARNUITY ELLIPTA 50 MCG/ACT IN AEPB: 1.0000 | INHALATION_SPRAY | Freq: Every day | RESPIRATORY_TRACT | 5 refills | Status: DC

## 2024-03-26 ENCOUNTER — Encounter: Payer: Self-pay | Admitting: Internal Medicine

## 2024-03-27 ENCOUNTER — Telehealth: Payer: Self-pay

## 2024-03-30 NOTE — Telephone Encounter (Signed)
 Spoke with patient regarding prior message.Patient  stated she doesn't have a allergie to Steroids and I have advised patient I will contact  [facility] .   Spoke with [facility] pharmacist regarding prior message. Patient stated back in 2016 patient has been proscribed Dulera and had Palpitations. Refilled Arnuity  Patient's voice was understanding .

## 2024-03-30 NOTE — Telephone Encounter (Signed)
 Patient has previously tolerated qvar  and flovent . No contrandications to steroid inhaler. Please fill inhaler asap to her pharmacy of choice - if it's mail order send her one to a local pharmacy x1 month.

## 2024-04-07 DIAGNOSIS — K5900 Constipation, unspecified: Secondary | ICD-10-CM | POA: Diagnosis not present

## 2024-04-07 DIAGNOSIS — R194 Change in bowel habit: Secondary | ICD-10-CM | POA: Diagnosis not present

## 2024-04-07 DIAGNOSIS — K635 Polyp of colon: Secondary | ICD-10-CM | POA: Diagnosis not present

## 2024-04-09 DIAGNOSIS — K635 Polyp of colon: Secondary | ICD-10-CM | POA: Diagnosis not present

## 2024-04-10 ENCOUNTER — Other Ambulatory Visit: Payer: Self-pay | Admitting: Gastroenterology

## 2024-04-30 DIAGNOSIS — H524 Presbyopia: Secondary | ICD-10-CM | POA: Diagnosis not present

## 2024-04-30 DIAGNOSIS — H35362 Drusen (degenerative) of macula, left eye: Secondary | ICD-10-CM | POA: Diagnosis not present

## 2024-04-30 DIAGNOSIS — H40013 Open angle with borderline findings, low risk, bilateral: Secondary | ICD-10-CM | POA: Diagnosis not present

## 2024-04-30 DIAGNOSIS — Z961 Presence of intraocular lens: Secondary | ICD-10-CM | POA: Diagnosis not present

## 2024-04-30 DIAGNOSIS — H52223 Regular astigmatism, bilateral: Secondary | ICD-10-CM | POA: Diagnosis not present

## 2024-04-30 DIAGNOSIS — H16143 Punctate keratitis, bilateral: Secondary | ICD-10-CM | POA: Diagnosis not present

## 2024-04-30 DIAGNOSIS — H5203 Hypermetropia, bilateral: Secondary | ICD-10-CM | POA: Diagnosis not present

## 2024-05-21 DIAGNOSIS — H16223 Keratoconjunctivitis sicca, not specified as Sjogren's, bilateral: Secondary | ICD-10-CM | POA: Diagnosis not present

## 2024-06-16 ENCOUNTER — Ambulatory Visit: Admitting: Internal Medicine

## 2024-08-02 ENCOUNTER — Emergency Department (HOSPITAL_BASED_OUTPATIENT_CLINIC_OR_DEPARTMENT_OTHER)

## 2024-08-02 ENCOUNTER — Emergency Department (HOSPITAL_BASED_OUTPATIENT_CLINIC_OR_DEPARTMENT_OTHER)
Admission: EM | Admit: 2024-08-02 | Discharge: 2024-08-02 | Disposition: A | Discharge status: Home / Self Care | Source: Ambulatory Visit | Attending: Emergency Medicine | Admitting: Emergency Medicine

## 2024-08-02 ENCOUNTER — Other Ambulatory Visit: Payer: Self-pay

## 2024-08-02 ENCOUNTER — Encounter (HOSPITAL_BASED_OUTPATIENT_CLINIC_OR_DEPARTMENT_OTHER): Payer: Self-pay | Admitting: Radiology

## 2024-08-02 DIAGNOSIS — R8281 Pyuria: Secondary | ICD-10-CM | POA: Diagnosis not present

## 2024-08-02 DIAGNOSIS — R1032 Left lower quadrant pain: Secondary | ICD-10-CM | POA: Diagnosis not present

## 2024-08-02 DIAGNOSIS — R3915 Urgency of urination: Secondary | ICD-10-CM | POA: Insufficient documentation

## 2024-08-02 DIAGNOSIS — N3289 Other specified disorders of bladder: Secondary | ICD-10-CM | POA: Diagnosis not present

## 2024-08-02 LAB — COMPREHENSIVE METABOLIC PANEL WITH GFR
ALT: 15 U/L (ref 0–44)
AST: 23 U/L (ref 15–41)
Albumin: 4.4 g/dL (ref 3.5–5.0)
Alkaline Phosphatase: 62 U/L (ref 38–126)
Anion gap: 12 (ref 5–15)
BUN: 13 mg/dL (ref 8–23)
CO2: 26 mmol/L (ref 22–32)
Calcium: 10.1 mg/dL (ref 8.9–10.3)
Chloride: 104 mmol/L (ref 98–111)
Creatinine, Ser: 0.8 mg/dL (ref 0.44–1.00)
GFR, Estimated: >60 mL/min (ref >60)
Glucose, Bld: 96 mg/dL (ref 70–99)
Potassium: 3.5 mmol/L (ref 3.5–5.1)
Sodium: 142 mmol/L (ref 135–145)
Total Bilirubin: 0.5 mg/dL (ref 0.0–1.2)
Total Protein: 7.2 g/dL (ref 6.5–8.1)

## 2024-08-02 LAB — CBC WITH DIFFERENTIAL/PLATELET
Abs Immature Granulocytes: 0.03 K/uL (ref 0.00–0.07)
Basophils Absolute: 0.1 K/uL (ref 0.0–0.1)
Basophils Relative: 1 %
Eosinophils Absolute: 0.2 K/uL (ref 0.0–0.5)
Eosinophils Relative: 2 %
HCT: 42.8 % (ref 36.0–46.0)
Hemoglobin: 14.1 g/dL (ref 12.0–15.0)
Immature Granulocytes: 0 %
Lymphocytes Relative: 31 %
Lymphs Abs: 3 K/uL (ref 0.7–4.0)
MCH: 27.6 pg (ref 26.0–34.0)
MCHC: 32.9 g/dL (ref 30.0–36.0)
MCV: 83.8 fL (ref 80.0–100.0)
Monocytes Absolute: 0.6 K/uL (ref 0.1–1.0)
Monocytes Relative: 7 %
Neutro Abs: 5.8 K/uL (ref 1.7–7.7)
Neutrophils Relative %: 59 %
Platelets: 214 K/uL (ref 150–400)
RBC: 5.11 MIL/uL (ref 3.87–5.11)
RDW: 14.4 % (ref 11.5–15.5)
WBC: 9.7 K/uL (ref 4.0–10.5)
nRBC: 0 % (ref 0.0–0.2)

## 2024-08-02 LAB — URINALYSIS, ROUTINE W REFLEX MICROSCOPIC
Bilirubin Urine: NEGATIVE
Glucose, UA: NEGATIVE mg/dL
Hgb urine dipstick: NEGATIVE
Ketones, ur: NEGATIVE mg/dL
Leukocytes,Ua: NEGATIVE
Nitrite: NEGATIVE
Protein, ur: NEGATIVE mg/dL
Specific Gravity, Urine: <1.005 — ABNORMAL LOW (ref 1.005–1.030)
pH: 7 (ref 5.0–8.0)

## 2024-08-02 LAB — LIPASE, BLOOD: Lipase: 25 U/L (ref 11–51)

## 2024-08-02 MED ORDER — IOHEXOL 300 MG/ML  SOLN
100.0000 mL | Freq: Once | INTRAMUSCULAR | Status: AC | PRN
  Administered 2024-08-02: 100 mL via INTRAVENOUS

## 2024-08-02 MED ORDER — CEPHALEXIN 500 MG PO CAPS: 500.0000 mg | ORAL_CAPSULE | Freq: Two times a day (BID) | ORAL | 0 refills | Status: AC

## 2024-08-02 NOTE — ED Notes (Signed)
 Patient to CT via w/c

## 2024-08-02 NOTE — ED Provider Notes (Signed)
 [facility] AT [facility] Provider Note   CSN: [id number] Arrival date & time: 08/02/24  0945     Patient presents with: Abdominal Pain   [name] is a 73 y.o. female.  {Add pertinent medical, surgical, social history, OB history to HPI:7319} 73 year old female with a history of C-section and bowel perforation status post bowel resection and inguinal hernia repair who presents emergency department with left lower quadrant abdominal pain.  Thursday night started having a gas-like sensation in her lower abdomen that is worse in her left lower quadrant.  Since then she tried eating some fiber and improved her symptoms.  Has been having normal bowel movements with 1 loose stool today.  Also has developed some suprapubic discomfort and urgency.  Went to urgent care and they thought that she may have a UTI based on her urinalysis.  They referred her to the emergency department for CT scan to rule out diverticulitis.  No fevers or chills.  No nausea or vomiting       Prior to Admission medications   Medication Sig Start Date End Date Taking? Authorizing Provider  albuterol  (VENTOLIN  HFA) 108 (90 Base) MCG/ACT inhaler Inhale 2 puffs into the lungs every 6 (six) hours as needed for wheezing or shortness of breath. 03/13/24   Hope Almarie ORN, NP  Ascorbic Acid (VITAMIN C PO) Take 1 tablet by mouth daily.     [provider]  b complex vitamins tablet Take 1 tablet by mouth daily.    [provider]  CALCIUM-MAGNESIUM  PO Take 1 tablet by mouth daily.    [provider]  Cholecalciferol (VITAMIN D) 50 MCG (2000 UT) tablet Take 2,000 Units by mouth daily.    [provider]  cycloSPORINE  (RESTASIS ) 0.05 % ophthalmic emulsion     [provider]  famotidine-calcium carbonate-magnesium  hydroxide (PEPCID COMPLETE) 10-800-165 MG chewable tablet Chew 1 tablet by mouth as needed.    [provider]  FIBER DIET  TABS Take 1 tablet by mouth as needed (constipation).    [provider]  fluticasone  (FLONASE ) 50 MCG/ACT nasal spray Place 1 spray into both nostrils daily. 11/13/22   Hope Almarie ORN, NP  Fluticasone  Furoate (ARNUITY ELLIPTA ) 50 MCG/ACT AEPB Inhale 1 puff into the lungs daily. 03/18/24   Desai, Nikita S, MD  Glucosamine-Chondroit-Vit C-Mn (GLUCOSAMINE CHONDROITIN COMPLX) TABS daily.    [provider]  hydrochlorothiazide  (HYDRODIURIL ) 25 MG tablet TAKE 1 TABLET BY MOUTH DAILY 11/14/22   Anner Alm ORN, MD  LECITHIN PO Take 1 tablet by mouth daily.    [provider]  loratadine (CLARITIN) 10 MG tablet Take 10 mg by mouth daily as needed for allergies.    [provider]  metoprolol  tartrate (LOPRESSOR ) 25 MG tablet Take 1 tablet (25 mg total) by mouth as needed. 08/06/23 11/04/23  Vannie Reche RAMAN, NP  Misc Natural Products (GLUCOSAMINE CHONDROITIN TRIPLE) TABS Take 1 tablet by mouth daily.    [provider]  montelukast  (SINGULAIR ) 10 MG tablet Take 1 tablet (10 mg total) by mouth at bedtime. 03/11/24   Desai, Nikita S, MD  Multiple Vitamin (MULTIVITAMIN) capsule Take 1 capsule by mouth daily.    [provider]  NITROGLYCERIN PO Take 0.4 mg by mouth as needed.    [provider]  omeprazole  (PRILOSEC) 20 MG capsule Take 20 mg by mouth daily.    [provider]  polyethylene glycol powder (MIRALAX ) 17 GM/SCOOP powder as needed for moderate  constipation. 10/03/20   [provider]  Probiotic Product (PROBIOTIC PO) Take 1 capsule by mouth daily.    [provider]  rosuvastatin (CRESTOR) 10 MG tablet Take 10 mg by mouth every evening.    [provider]    Allergies: Other and Dulera [mometasone furo-formoterol fum]    Review of Systems  Updated Vital Signs BP (!) 164/85 (BP Location: Right Arm)   Pulse 75   Temp 98.3 F (36.8 C) (Oral)   Resp 16   LMP  (LMP Unknown)   SpO2 99%    Physical Exam Vitals and nursing note reviewed.  Constitutional:      General: She is not in acute distress.    Appearance: She is well-developed.  HENT:     Head: Normocephalic and atraumatic.     Right Ear: External ear normal.     Left Ear: External ear normal.     Nose: Nose normal.  Eyes:     Extraocular Movements: Extraocular movements intact.     Conjunctiva/sclera: Conjunctivae normal.     Pupils: Pupils are equal, round, and reactive to light.  Abdominal:     General: Abdomen is flat. There is no distension.     Palpations: Abdomen is soft. There is no mass.     Tenderness: There is abdominal tenderness (Left lower quadrant). There is no guarding.     Hernia: No hernia is present.  Musculoskeletal:     Right lower leg: No edema.     Left lower leg: No edema.  Skin:    General: Skin is warm and dry.  Neurological:     Mental Status: She is alert and oriented to person, place, and time. Mental status is at baseline.  Psychiatric:        Mood and Affect: Mood normal.     (all labs ordered are listed, but only abnormal results are displayed) Labs Reviewed  COMPREHENSIVE METABOLIC PANEL WITH GFR  LIPASE, BLOOD  CBC WITH DIFFERENTIAL/PLATELET  URINALYSIS, ROUTINE W REFLEX MICROSCOPIC    EKG: None  Radiology: No results found.  {Document cardiac monitor, telemetry assessment procedure when appropriate:32947} Procedures   Medications Ordered in the ED - No data to display    {Click here for ABCD2, HEART and other calculators REFRESH Note before signing:1}                              Medical Decision Making Amount and/or Complexity of Data Reviewed Labs: ordered. Radiology: ordered.  Risk Prescription drug management.   ***  {Document critical care time when appropriate  Document review of labs and clinical decision tools ie CHADS2VASC2, etc  Document your independent review of radiology images and any outside records  Document your discussion  with family members, caretakers and with consultants  Document social determinants of health affecting pt's care  Document your decision making why or why not admission, treatments were needed:32947:::1}   Final diagnoses:  None    ED Discharge Orders     None

## 2024-08-02 NOTE — ED Triage Notes (Signed)
 Reports LLQ pain x 3 days. Seen at [facility] and requested CT. Dx w/ UTI.

## 2024-08-02 NOTE — Discharge Instructions (Addendum)
 You were seen for abdominal pain in the emergency department.   At home, please take the antibiotics if you start developing painful urination, fevers, flank pain.    Check your MyChart online for the results of any tests that had not resulted by the time you left the emergency department.   Follow-up with your primary doctor in 2-3 days regarding your visit.    Return immediately to the emergency department if you experience any of the following: worsening pain, vomiting despite the medication, or any other concerning symptoms.    Thank you for visiting our Emergency Department. It was a pleasure taking care of you today.

## 2024-08-19 DIAGNOSIS — J45909 Unspecified asthma, uncomplicated: Secondary | ICD-10-CM | POA: Diagnosis not present

## 2024-08-19 DIAGNOSIS — R051 Acute cough: Secondary | ICD-10-CM | POA: Diagnosis not present

## 2024-08-26 DIAGNOSIS — R059 Cough, unspecified: Secondary | ICD-10-CM | POA: Diagnosis not present

## 2024-08-26 DIAGNOSIS — H6120 Impacted cerumen, unspecified ear: Secondary | ICD-10-CM | POA: Diagnosis not present

## 2024-08-26 DIAGNOSIS — R062 Wheezing: Secondary | ICD-10-CM | POA: Diagnosis not present

## 2024-09-01 ENCOUNTER — Encounter (INDEPENDENT_AMBULATORY_CARE_PROVIDER_SITE_OTHER): Payer: Self-pay

## 2024-09-02 ENCOUNTER — Encounter (HOSPITAL_COMMUNITY): Payer: Self-pay | Admitting: Gastroenterology

## 2024-09-02 ENCOUNTER — Ambulatory Visit (HOSPITAL_COMMUNITY)
Admission: RE | Admit: 2024-09-02 | Discharge: 2024-09-02 | Disposition: A | Discharge status: Home / Self Care | Attending: Gastroenterology | Admitting: Gastroenterology

## 2024-09-02 ENCOUNTER — Encounter (HOSPITAL_COMMUNITY)
Admission: RE | Disposition: A | Discharge status: Home / Self Care | Payer: Self-pay | Source: Home / Self Care | Attending: Gastroenterology

## 2024-09-02 DIAGNOSIS — K5900 Constipation, unspecified: Secondary | ICD-10-CM | POA: Insufficient documentation

## 2024-09-02 HISTORY — PX: ANAL RECTAL MANOMETRY: SHX6358

## 2024-09-02 SURGERY — MANOMETRY, ANORECTAL

## 2024-09-02 NOTE — Progress Notes (Signed)
 Anal rectal manometry performed per protocol without complications.  Patient tolerated well.  Balloon expulsion test performed per protocol without complications.  Patient tolerated well.  Expelled balloon after 45 seconds.

## 2024-09-03 ENCOUNTER — Encounter (HOSPITAL_COMMUNITY): Payer: Self-pay | Admitting: Gastroenterology

## 2024-09-04 DIAGNOSIS — K5904 Chronic idiopathic constipation: Secondary | ICD-10-CM | POA: Diagnosis not present

## 2024-09-09 ENCOUNTER — Encounter (INDEPENDENT_AMBULATORY_CARE_PROVIDER_SITE_OTHER): Payer: Self-pay

## 2024-09-09 ENCOUNTER — Ambulatory Visit (INDEPENDENT_AMBULATORY_CARE_PROVIDER_SITE_OTHER)

## 2024-09-09 VITALS — BP 126/84 | HR 74 | Temp 97.9°F | Ht 69.0 in | Wt 182.0 lb

## 2024-09-09 DIAGNOSIS — R0982 Postnasal drip: Secondary | ICD-10-CM | POA: Diagnosis not present

## 2024-09-09 DIAGNOSIS — H6993 Unspecified Eustachian tube disorder, bilateral: Secondary | ICD-10-CM

## 2024-09-09 DIAGNOSIS — H65193 Other acute nonsuppurative otitis media, bilateral: Secondary | ICD-10-CM

## 2024-09-09 DIAGNOSIS — H938X3 Other specified disorders of ear, bilateral: Secondary | ICD-10-CM

## 2024-09-09 DIAGNOSIS — H9313 Tinnitus, bilateral: Secondary | ICD-10-CM | POA: Diagnosis not present

## 2024-09-09 NOTE — Progress Notes (Signed)
 Dear Dr. Mickie, Here is my assessment for our mutual patient, Skylee Baird. Thank you for allowing me the opportunity to care for your patient. Please do not hesitate to contact me should you have any other questions. Sincerely, Dr. Hadassah Parody  Otolaryngology Clinic Note Referring provider: Dr. Mickie HPI:  [name] is a 73 y.o. female kindly referred by Dr. Mickie for evaluation of ear fullness and tinnitus following a recent trip.  Discussed the use of AI scribe software for clinical note transcription with the patient, who gave verbal consent to proceed. History of Present Illness  Aural fullness and tinnitus - Significant ear fullness and tinnitus since returning from a trip on September 14th - Right ear more affected than left - Right ear initially did not 'pop' after flying; 'popped' two days ago with some relief - Continues to experience a 'pop, pop, pop' sensation when swallowing - this has been chronic for years  - Persistent ringing in both ears - No ear drainage - History of frequent ear infections during childhood, no history of ear tube placement - Primary care physician removed significant cerumen, but fullness and tinnitus persisted - Concerned that upcoming flight on October 28th may exacerbate symptoms  Postnasal drip and nasal congestion - Persistent postnasal drip, particularly bothersome in the mornings - Manages symptoms with Flonase  and antihistamines   Independent Review of Additional Tests or Records:  Referral note Rosette Mickie, MD (08/31/24): bilateral ear fullness x 1 month   CMP (08/02/24): BUN 13, Cr 0.8  TSH (08/06/23): 1.6 PMH/Meds/All/SocHx/FamHx/ROS:   Past Medical History:  Diagnosis Date   Arthritis    Asthma    Complication of anesthesia    patient report she had a saddle block during c section and was aware during surgery that was being  cut on    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   Stable:CTA Chest 10/20/2019:  Ascending thoracic aorta greatest diameter 39 mm (compared to 2016 - 37 mm no significant change).SABRA   GERD (gastroesophageal reflux disease)    Hypertension    Pneumonia      Past Surgical History:  Procedure Laterality Date   ANAL RECTAL MANOMETRY N/A 09/02/2024   Procedure: MANOMETRY, ANORECTAL;  Surgeon: Burnette Fallow, MD;  Location: [facility];  Service: Gastroenterology;  Laterality: N/A;   APPENDECTOMY  2012   bladder disection     CESAREAN SECTION     x 2   Chest CTA  10/2019   CTA Chest 10/20/2019: Ascending thoracic aorta greatest diameter 39 mm (compared to 2016 - 37 mm no significant change)..   COLON RESECTION  1982   1982   DILATION AND CURETTAGE, DIAGNOSTIC / THERAPEUTIC     HERNIA REPAIR     mesh in place    TONSILLECTOMY     TOTAL HIP ARTHROPLASTY Right 10/28/2018   Procedure: RIGHT TOTAL HIP ARTHROPLASTY ANTERIOR APPROACH;  Surgeon: Ernie Cough, MD;  Location: [facility] ORS;  Service: Orthopedics;  Laterality: Right;  70 mins   TRANSTHORACIC ECHOCARDIOGRAM  10/2020   A) 10/2019: EF 60 to 65%. ? GRII DD w/ Nl LA size (does not correlate!), Nl RA size.  AoV ~ cannot exclude bicuspid, No AS or sclerosis.  Mild dilation of ascending aorta-4 mm.;; B) 12/2019: EF 55 to 60%.  No R WMA.  Normal D Fxn.  Mild LA dilation.  Normal aortic valve.  Stable aortic dilation-39 mm   XI ROBOTIC ASSISTED VENTRAL HERNIA N/A 12/13/2020   Procedure: ROBOTIC INCISIONAL HERNIA REPAIR WITH MESH;  Surgeon: Rubin Calamity, MD;  Location: [facility] OR;  Service: General;  Laterality: N/A;    Family History  Problem Relation Age of Onset   Asthma Brother    Asthma Paternal Grandfather    Clotting disorder Mother    Asthma Brother      Social Connections: Not on file     Current Outpatient Medications  Medication Instructions   albuterol  (VENTOLIN  HFA) 108 (90 Base) MCG/ACT inhaler 2 puffs, Inhalation, Every 6 hours PRN   Ascorbic Acid (VITAMIN C PO) 1 tablet, Daily   b complex vitamins tablet  1 tablet, Daily   CALCIUM-MAGNESIUM  PO 1 tablet, Daily   cycloSPORINE  (RESTASIS ) 0.05 % ophthalmic emulsion    famotidine-calcium carbonate-magnesium  hydroxide (PEPCID COMPLETE) 10-800-165 MG chewable tablet 1 tablet, As needed   FIBER DIET TABS 1 tablet, As needed   fluticasone  (FLONASE ) 50 MCG/ACT nasal spray 1 spray, Each Nare, Daily   Fluticasone  Furoate (ARNUITY ELLIPTA ) 50 MCG/ACT AEPB 1 puff, Inhalation, Daily   Glucosamine-Chondroit-Vit C-Mn (GLUCOSAMINE CHONDROITIN COMPLX) TABS Daily   hydrochlorothiazide  (HYDRODIURIL ) 25 mg, Oral, Daily   LECITHIN PO 1 tablet, Daily   loratadine (CLARITIN) 10 mg, Daily PRN   metoprolol  tartrate (LOPRESSOR ) 25 mg, Oral, As needed   Misc Natural Products (GLUCOSAMINE CHONDROITIN TRIPLE) TABS 1 tablet, Daily   montelukast  (SINGULAIR ) 10 mg, Oral, Daily at bedtime   Multiple Vitamin (MULTIVITAMIN) capsule 1 capsule, Daily   NITROGLYCERIN PO 0.4 mg, As needed   omeprazole  (PRILOSEC) 20 mg, Daily   polyethylene glycol powder (MIRALAX ) 17 GM/SCOOP powder As needed   Probiotic Product (PROBIOTIC PO) 1 capsule, Daily   rosuvastatin (CRESTOR) 10 mg, Every evening   Vitamin D 2,000 Units, Daily     Physical Exam:   BP 126/84 (BP Location: Right Arm, Patient Position: Sitting)   Pulse 74   Temp 97.9 F (36.6 C)   Ht 5' 9 (1.753 m)   Wt 182 lb (82.6 kg)   LMP  (LMP Unknown)   SpO2 95%   BMI 26.88 kg/m   Salient findings:  CN II-XII intact Given history and complaints, ear microscopy was indicated and performed for evaluation with findings as below in physical exam section and in procedures Bilateral EAC clear. Bilateral middle ear effusion, no signs of infection.  No lesions of oral cavity/oropharynx No respiratory distress or stridor  Seprately Identifiable Procedures:  Prior to initiating any procedures, risks/benefits/alternatives were explained to the patient and verbal consent obtained.  Procedure (09/09/2024): Bilateral ear microscopy  using microscope (CPT [id number]) Pre-procedure diagnosis: bilateral ear fullness, bilateral eustachian tube dysfunction, bilateral tinnitus Post-procedure diagnosis: bilateral ear fullness, bilateral eustachian tube dysfunction, bilateral tinnitus, bilateral middle ear effusion Indication: see above; given patient's otologic complaints and history, for improved and comprehensive examination of external ear and tympanic membrane, bilateral otologic examination using microscope was performed. Prior to proceeding, verbal consent was obtained after discussion of R/B/A  Procedure: Patient was placed semi-recumbent. Both ear canals were examined using the microscope with findings above. Patient tolerated the procedure well.   Impression & Plans:  Tyshea Imel is a 73 y.o. female with   Assessment and Plan Assessment & Plan Bilateral middle ear effusion Bilateral Eustachian tube dysfunction Bilateral tinnitus  Bilateral ear fullness Bilateral middle ear effusion post-viral illness, right ear more affected. Fluid present, no infection. Symptoms slightly improved, expected to resolve in two months. Flying may cause pain. - Discussed observation, myringotomy, reversible ear tube placement - Schedule follow-up one week before October 28th to assess. -  Consider myringotomy if symptoms persist. - Consider reversible ear tube if symptoms persist and myringotomy insufficient. - Cancel follow-up if symptoms resolve.  Postnasal drip Chronic postnasal drip, likely due to allergies or acid reflux. Symptoms include throat clearing and mucus. Current management provides some relief. - Continue antihistamines and nasal steroid sprays. - Manage acid reflux to reduce symptoms.   Thank you for allowing me the opportunity to care for your patient. Please do not hesitate to contact me should you have any other questions.  Sincerely, Hadassah Parody, MD Otolaryngologist (ENT), [facility] Phone:  [phone] Fax: [phone]

## 2024-09-25 ENCOUNTER — Ambulatory Visit (INDEPENDENT_AMBULATORY_CARE_PROVIDER_SITE_OTHER)

## 2024-09-25 DIAGNOSIS — B356 Tinea cruris: Secondary | ICD-10-CM | POA: Diagnosis not present

## 2024-11-20 ENCOUNTER — Encounter: Payer: Self-pay | Admitting: Cardiology

## 2025-02-22 ENCOUNTER — Ambulatory Visit: Admitting: Cardiology
